# Patient Record
Sex: Female | Born: 1970 | Race: Asian | Hispanic: No | Marital: Married | Smoking: Never smoker
Health system: Southern US, Community
[De-identification: ages and names within clinical notes are randomized; demographics above are authoritative.]

## PROBLEM LIST (undated history)

## (undated) DIAGNOSIS — I10 Essential (primary) hypertension: Secondary | ICD-10-CM

## (undated) DIAGNOSIS — N6092 Unspecified benign mammary dysplasia of left breast: Secondary | ICD-10-CM

## (undated) DIAGNOSIS — N6320 Unspecified lump in the left breast, unspecified quadrant: Secondary | ICD-10-CM

## (undated) HISTORY — PX: NO PAST SURGERIES: SHX2092

## (undated) HISTORY — PX: BREAST EXCISIONAL BIOPSY: SUR124

---

## 2005-03-16 ENCOUNTER — Other Ambulatory Visit: Admission: RE | Admit: 2005-03-16 | Discharge: 2005-03-16 | Payer: Self-pay | Admitting: Gynecology

## 2006-04-12 ENCOUNTER — Other Ambulatory Visit: Admission: RE | Admit: 2006-04-12 | Discharge: 2006-04-12 | Payer: Self-pay | Admitting: Gynecology

## 2006-05-16 ENCOUNTER — Ambulatory Visit (HOSPITAL_COMMUNITY): Admission: RE | Admit: 2006-05-16 | Discharge: 2006-05-16 | Payer: Self-pay | Admitting: Gynecology

## 2007-04-02 ENCOUNTER — Inpatient Hospital Stay (HOSPITAL_COMMUNITY): Admission: AD | Admit: 2007-04-02 | Discharge: 2007-04-04 | Payer: Self-pay | Admitting: Obstetrics and Gynecology

## 2007-12-13 ENCOUNTER — Encounter: Admission: RE | Admit: 2007-12-13 | Discharge: 2007-12-13 | Payer: Self-pay | Admitting: Obstetrics and Gynecology

## 2008-06-14 ENCOUNTER — Other Ambulatory Visit: Admission: RE | Admit: 2008-06-14 | Discharge: 2008-06-14 | Payer: Self-pay | Admitting: Obstetrics and Gynecology

## 2008-06-14 ENCOUNTER — Encounter: Payer: Self-pay | Admitting: Obstetrics and Gynecology

## 2008-06-14 ENCOUNTER — Ambulatory Visit: Payer: Self-pay | Admitting: Obstetrics and Gynecology

## 2008-12-19 ENCOUNTER — Encounter: Admission: RE | Admit: 2008-12-19 | Discharge: 2008-12-19 | Payer: Self-pay | Admitting: Obstetrics and Gynecology

## 2009-06-16 ENCOUNTER — Other Ambulatory Visit: Admission: RE | Admit: 2009-06-16 | Discharge: 2009-06-16 | Payer: Self-pay | Admitting: Obstetrics and Gynecology

## 2009-06-16 ENCOUNTER — Ambulatory Visit: Payer: Self-pay | Admitting: Obstetrics and Gynecology

## 2010-01-09 ENCOUNTER — Encounter: Admission: RE | Admit: 2010-01-09 | Discharge: 2010-01-09 | Payer: Self-pay | Admitting: Obstetrics and Gynecology

## 2010-06-26 ENCOUNTER — Ambulatory Visit: Payer: Self-pay | Admitting: Gynecology

## 2010-06-26 ENCOUNTER — Other Ambulatory Visit
Admission: RE | Admit: 2010-06-26 | Discharge: 2010-06-26 | Payer: Self-pay | Source: Home / Self Care | Admitting: Gynecology

## 2010-07-12 NOTE — L&D Delivery Note (Signed)
Delivery Note At 4:14 AM a viable and healthy female was delivered via Vaginal, Spontaneous Delivery (Presentation:ROA ).  APGAR: 9,9 ; weight 6 lb 5 oz (2863 g).   Placenta status spontaneous and intact with 3 vessel Cord:  with the following complications: none .  Cord pH: na. Nuchal cord x one reduced on perineum.  Anesthesia:  epidural Episiotomy: none Lacerations: second degree Suture Repair: 2.0 vicryl rapide Est. Blood Loss (mL): 300  Mom to postpartum.  Baby to nursery-stable.  Christin Mccreedy J 05/27/2011, 4:29 AM

## 2010-11-24 NOTE — H&P (Signed)
NAME:  Keziah, MING-I                 ACCOUNT NO.:  [id number]   MEDICAL RECORD NO.:  [id number]          PATIENT TYPE:  INP   LOCATION:  9163                          FACILITY:  [facility]   PHYSICIAN:  Lenoard Aden, M.D.DATE OF BIRTH:  March 15, 1971   DATE OF ADMISSION:  04/02/2007  DATE OF DISCHARGE:                              HISTORY & PHYSICAL   PRESENTING COMPLAINT:  Labor.   She is a 40 year old Asian female, G3, P0 at 13 plus weeks' gestation  who presents with spontaneous labor.   She has no known drug allergies.   MEDICATIONS:  Prenatal vitamins.   She is a nonsmoker, nondrinker.   She denies domestic or physical violence.   PAST MEDICAL HISTORY:  Questionable fibroids, not seen on previous  ultrasound.  No other medical or surgical hospitalizations.   FAMILY HISTORY:  Diabetes, urolithiasis, uterine cancer, stroke, and  kidney disease.   PRENATAL COURSE:  Complicated by a questionable large for gestational  age fetus.   PHYSICAL EXAMINATION:  GENERAL:  She is a well-developed, well-  nourished, Asian female in no acute distress.  HEENT:  Normal.  LUNGS:  Clear.  HEART:  Regular rhythm.  ABDOMEN:  Soft, gravid.  Estimated fetal weight is 8 to 8-1/2 pounds.  PELVIC:  Cervix is 4-cm, 100% vertex, minus 1.  EXTREMITIES:  Revealed no cords.  NEUROLOGIC:  Nonfocal.  SKIN:  Intact.   IMPRESSION:  Term intrauterine pregnancy in active labor.   PLAN:  Epidural Pitocin as needed.  Anticipate cautious attempt at  vaginal delivery.      Lenoard Aden, M.D.  Electronically Signed     RJT/MEDQ  D:  04/02/2007  T:  04/02/2007  Job:  [id number]

## 2011-04-22 LAB — CBC
HCT: 38.9
Hemoglobin: 11.4 — ABNORMAL LOW
Hemoglobin: 13.6
MCHC: 35.7
MCV: 96.7
MCV: 96.8
RBC: 3.32 — ABNORMAL LOW
RDW: 13.9

## 2011-05-27 ENCOUNTER — Encounter (HOSPITAL_COMMUNITY): Payer: Self-pay | Admitting: Anesthesiology

## 2011-05-27 ENCOUNTER — Encounter (HOSPITAL_COMMUNITY): Payer: Self-pay | Admitting: *Deleted

## 2011-05-27 ENCOUNTER — Inpatient Hospital Stay (HOSPITAL_COMMUNITY): Payer: PRIVATE HEALTH INSURANCE | Admitting: Anesthesiology

## 2011-05-27 ENCOUNTER — Inpatient Hospital Stay (HOSPITAL_COMMUNITY)
Admission: AD | Admit: 2011-05-27 | Discharge: 2011-05-29 | DRG: 775 | Disposition: A | Discharge status: Home / Self Care | Payer: PRIVATE HEALTH INSURANCE | Source: Ambulatory Visit | Attending: Obstetrics and Gynecology | Admitting: Obstetrics and Gynecology

## 2011-05-27 DIAGNOSIS — Z23 Encounter for immunization: Secondary | ICD-10-CM

## 2011-05-27 DIAGNOSIS — O09529 Supervision of elderly multigravida, unspecified trimester: Secondary | ICD-10-CM | POA: Diagnosis present

## 2011-05-27 LAB — ABO/RH

## 2011-05-27 LAB — CBC
HCT: 37.8 % (ref 36.0–46.0)
Hemoglobin: 12.3 g/dL (ref 12.0–15.0)
MCH: 32 pg (ref 26.0–34.0)
MCHC: 32.5 g/dL (ref 30.0–36.0)

## 2011-05-27 LAB — HEPATITIS B SURFACE ANTIGEN: Hepatitis B Surface Ag: NEGATIVE

## 2011-05-27 LAB — STREP B DNA PROBE: GBS: NEGATIVE

## 2011-05-27 LAB — RPR: RPR: NONREACTIVE

## 2011-05-27 MED ORDER — FENTANYL 2.5 MCG/ML BUPIVACAINE 1/10 % EPIDURAL INFUSION (WH - ANES)
14.0000 mL/h | INTRAMUSCULAR | Status: DC
  Filled 2011-05-27: qty 60

## 2011-05-27 MED ORDER — IBUPROFEN 600 MG PO TABS
600.0000 mg | ORAL_TABLET | Freq: Four times a day (QID) | ORAL | Status: DC
  Administered 2011-05-27 – 2011-05-29 (×9): 600 mg via ORAL
  Filled 2011-05-27 (×9): qty 1

## 2011-05-27 MED ORDER — WITCH HAZEL-GLYCERIN EX PADS: 1.0000 "application " | MEDICATED_PAD | CUTANEOUS | Status: DC | PRN

## 2011-05-27 MED ORDER — SENNOSIDES-DOCUSATE SODIUM 8.6-50 MG PO TABS
2.0000 | ORAL_TABLET | Freq: Every day | ORAL | Status: DC
  Administered 2011-05-28: 2 via ORAL

## 2011-05-27 MED ORDER — IBUPROFEN 600 MG PO TABS: 600.0000 mg | ORAL_TABLET | Freq: Four times a day (QID) | ORAL | Status: DC | PRN

## 2011-05-27 MED ORDER — OXYCODONE-ACETAMINOPHEN 5-325 MG PO TABS: 2.0000 | ORAL_TABLET | ORAL | Status: DC | PRN

## 2011-05-27 MED ORDER — EPHEDRINE 5 MG/ML INJ
10.0000 mg | INTRAVENOUS | Status: DC | PRN
  Filled 2011-05-27: qty 4

## 2011-05-27 MED ORDER — PRENATAL PLUS 27-1 MG PO TABS
1.0000 | ORAL_TABLET | Freq: Every day | ORAL | Status: DC
  Administered 2011-05-27 – 2011-05-29 (×4): 1 via ORAL
  Filled 2011-05-27 (×3): qty 1

## 2011-05-27 MED ORDER — LACTATED RINGERS IV SOLN
500.0000 mL | Freq: Once | INTRAVENOUS | Status: AC
  Administered 2011-05-27: 1000 mL via INTRAVENOUS

## 2011-05-27 MED ORDER — LIDOCAINE HCL 1.5 % IJ SOLN
INTRAMUSCULAR | Status: DC | PRN
  Administered 2011-05-27 (×2): 5 mL via EPIDURAL

## 2011-05-27 MED ORDER — ZOLPIDEM TARTRATE 5 MG PO TABS: 5.0000 mg | ORAL_TABLET | Freq: Every evening | ORAL | Status: DC | PRN

## 2011-05-27 MED ORDER — TETANUS-DIPHTH-ACELL PERTUSSIS 5-2.5-18.5 LF-MCG/0.5 IM SUSP
0.5000 mL | Freq: Once | INTRAMUSCULAR | Status: AC
  Administered 2011-05-28: 0.5 mL via INTRAMUSCULAR
  Filled 2011-05-27: qty 0.5

## 2011-05-27 MED ORDER — BENZOCAINE-MENTHOL 20-0.5 % EX AERO
INHALATION_SPRAY | CUTANEOUS | Status: AC
  Administered 2011-05-27: 1 via TOPICAL
  Filled 2011-05-27: qty 56

## 2011-05-27 MED ORDER — FLEET ENEMA 7-19 GM/118ML RE ENEM: 1.0000 | ENEMA | RECTAL | Status: DC | PRN

## 2011-05-27 MED ORDER — FENTANYL 2.5 MCG/ML BUPIVACAINE 1/10 % EPIDURAL INFUSION (WH - ANES)
INTRAMUSCULAR | Status: DC | PRN
  Administered 2011-05-27: 14 mL/h via EPIDURAL

## 2011-05-27 MED ORDER — METHYLERGONOVINE MALEATE 0.2 MG/ML IJ SOLN: 0.2000 mg | INTRAMUSCULAR | Status: DC | PRN

## 2011-05-27 MED ORDER — LACTATED RINGERS IV SOLN: 500.0000 mL | INTRAVENOUS | Status: DC | PRN

## 2011-05-27 MED ORDER — LANOLIN HYDROUS EX OINT: TOPICAL_OINTMENT | CUTANEOUS | Status: DC | PRN

## 2011-05-27 MED ORDER — LACTATED RINGERS IV SOLN
INTRAVENOUS | Status: DC
  Administered 2011-05-27 (×2): via INTRAVENOUS

## 2011-05-27 MED ORDER — LIDOCAINE HCL (PF) 1 % IJ SOLN
30.0000 mL | INTRAMUSCULAR | Status: DC | PRN
  Filled 2011-05-27: qty 30

## 2011-05-27 MED ORDER — DIPHENHYDRAMINE HCL 50 MG/ML IJ SOLN: 12.5000 mg | INTRAMUSCULAR | Status: DC | PRN

## 2011-05-27 MED ORDER — ONDANSETRON HCL 4 MG/2ML IJ SOLN: 4.0000 mg | Freq: Four times a day (QID) | INTRAMUSCULAR | Status: DC | PRN

## 2011-05-27 MED ORDER — DIBUCAINE 1 % RE OINT: 1.0000 "application " | TOPICAL_OINTMENT | RECTAL | Status: DC | PRN

## 2011-05-27 MED ORDER — BENZOCAINE-MENTHOL 20-0.5 % EX AERO
1.0000 "application " | INHALATION_SPRAY | CUTANEOUS | Status: DC | PRN
  Administered 2011-05-27: 1 via TOPICAL

## 2011-05-27 MED ORDER — SIMETHICONE 80 MG PO CHEW: 80.0000 mg | CHEWABLE_TABLET | ORAL | Status: DC | PRN

## 2011-05-27 MED ORDER — ONDANSETRON HCL 4 MG/2ML IJ SOLN: 4.0000 mg | INTRAMUSCULAR | Status: DC | PRN

## 2011-05-27 MED ORDER — PHENYLEPHRINE 40 MCG/ML (10ML) SYRINGE FOR IV PUSH (FOR BLOOD PRESSURE SUPPORT): 80.0000 ug | PREFILLED_SYRINGE | INTRAVENOUS | Status: DC | PRN

## 2011-05-27 MED ORDER — ACETAMINOPHEN 325 MG PO TABS: 650.0000 mg | ORAL_TABLET | ORAL | Status: DC | PRN

## 2011-05-27 MED ORDER — METHYLERGONOVINE MALEATE 0.2 MG PO TABS: 0.2000 mg | ORAL_TABLET | ORAL | Status: DC | PRN

## 2011-05-27 MED ORDER — OXYCODONE-ACETAMINOPHEN 5-325 MG PO TABS: 1.0000 | ORAL_TABLET | ORAL | Status: DC | PRN

## 2011-05-27 MED ORDER — OXYTOCIN 20 UNITS IN LACTATED RINGERS INFUSION - SIMPLE: 125.0000 mL/h | INTRAVENOUS | Status: DC

## 2011-05-27 MED ORDER — CITRIC ACID-SODIUM CITRATE 334-500 MG/5ML PO SOLN: 30.0000 mL | ORAL | Status: DC | PRN

## 2011-05-27 MED ORDER — ONDANSETRON HCL 4 MG PO TABS: 4.0000 mg | ORAL_TABLET | ORAL | Status: DC | PRN

## 2011-05-27 MED ORDER — EPHEDRINE 5 MG/ML INJ: 10.0000 mg | INTRAVENOUS | Status: DC | PRN

## 2011-05-27 MED ORDER — PHENYLEPHRINE 40 MCG/ML (10ML) SYRINGE FOR IV PUSH (FOR BLOOD PRESSURE SUPPORT)
80.0000 ug | PREFILLED_SYRINGE | INTRAVENOUS | Status: DC | PRN
  Filled 2011-05-27: qty 5

## 2011-05-27 MED ORDER — OXYTOCIN BOLUS FROM INFUSION
500.0000 mL | Freq: Once | INTRAVENOUS | Status: DC
  Filled 2011-05-27: qty 1000
  Filled 2011-05-27: qty 500

## 2011-05-27 MED ORDER — DIPHENHYDRAMINE HCL 25 MG PO CAPS: 25.0000 mg | ORAL_CAPSULE | Freq: Four times a day (QID) | ORAL | Status: DC | PRN

## 2011-05-27 MED ORDER — OXYTOCIN 20 UNITS IN LACTATED RINGERS INFUSION - SIMPLE: 125.0000 mL/h | Freq: Once | INTRAVENOUS | Status: DC

## 2011-05-27 NOTE — Progress Notes (Signed)
  PPD 0 SVD  S:  Reports feeling tired / cramping / soreness with stitches             Tolerating po/ No nausea or vomiting             Bleeding is moderate             Pain controlled withprescription NSAID's including motrin             Up ad lib / ambulatory  Newborn breast feeding  / female infant  O:  A & O x 3 NAD             VS: Blood pressure 113/66, pulse 63, temperature 97.6 F (36.4 C), temperature source Oral, resp. rate 18, height 5\' 6"  (1.676 m), weight 73.483 kg (162 lb), SpO2 84.00%, unknown if currently breastfeeding.  LABS: Lab Results  Component Value Date   WBC 10.9* 05/27/2011   HGB 12.3 05/27/2011   HCT 37.8 05/27/2011   MCV 98.4 05/27/2011   PLT 301 05/27/2011     Lungs: Clear and unlabored  Heart: regular rate and rhythm / no mumurs  Abdomen: soft, non-tender, non-distended              Fundus: firm, non-tender, Ueven  Perineum: mild edema - ice pack in place  Lochia: light  Extremities: no edema, no calf pain or tenderness  A: PPD # 0 SVD   Doing well - stable status  P:  Routine post partum orders    Jayma Volpi 05/27/2011, 9:45 AM

## 2011-05-27 NOTE — Anesthesia Preprocedure Evaluation (Signed)
Anesthesia Evaluation  Patient identified by MRN, date of birth, ID band Patient awake    Reviewed: Allergy & Precautions, H&P , NPO status , Patient's Chart, lab work & pertinent test results, reviewed documented beta blocker date and time   Airway Mallampati: I TM Distance: >3 FB Neck ROM: full    Dental No notable dental hx. (+) Teeth Intact   Pulmonary neg pulmonary ROS,    Pulmonary exam normal       Cardiovascular neg cardio ROS     Neuro/Psych Negative Neurological ROS  Negative Psych ROS   GI/Hepatic negative GI ROS, Neg liver ROS,   Endo/Other  Negative Endocrine ROS  Renal/GU negative Renal ROS  Genitourinary negative   Musculoskeletal negative musculoskeletal ROS (+)   Abdominal Normal abdominal exam  (+)   Peds negative pediatric ROS (+)  Hematology negative hematology ROS (+)   Anesthesia Other Findings   Reproductive/Obstetrics negative OB ROS (+) Pregnancy                           Anesthesia Physical Anesthesia Plan  ASA: II  Anesthesia Plan: Epidural   Post-op Pain Management:    Induction:   Airway Management Planned:   Additional Equipment:   Intra-op Plan:   Post-operative Plan:   Informed Consent: I have reviewed the patients History and Physical, chart, labs and discussed the procedure including the risks, benefits and alternatives for the proposed anesthesia with the patient or authorized representative who has indicated his/her understanding and acceptance.   Dental Advisory Given and History available from chart only  Plan Discussed with: CRNA  Anesthesia Plan Comments:         Anesthesia Quick Evaluation

## 2011-05-27 NOTE — Anesthesia Procedure Notes (Signed)
Epidural Patient location during procedure: OB Start time: 05/27/2011 3:41 AM End time: 05/27/2011 3:45 AM Reason for block: procedure for pain  Staffing Anesthesiologist: Sandrea Hughs Performed by: anesthesiologist   Preanesthetic Checklist Completed: patient identified, site marked, surgical consent, pre-op evaluation, timeout performed, IV checked, risks and benefits discussed and monitors and equipment checked  Epidural Patient position: sitting Prep: site prepped and draped and DuraPrep Patient monitoring: continuous pulse ox and blood pressure Approach: midline Injection technique: LOR air  Needle:  Needle type: Tuohy  Needle gauge: 17 G Needle length: 9 cm Needle insertion depth: 5 cm cm Catheter type: closed end flexible Catheter size: 19 Gauge Catheter at skin depth: 10 cm Test dose: negative and 1.5% lidocaine  Assessment Sensory level: T10 Events: blood not aspirated, injection not painful, no injection resistance, negative IV test and no paresthesia

## 2011-05-27 NOTE — Progress Notes (Signed)
Pt reports ROM at 2300, some contractions. G2P1, svd

## 2011-05-27 NOTE — Progress Notes (Signed)
Ming-I Wool is a 40 y.o. G2P1001 at [redacted]w[redacted]d by LMP admitted for active labor  Subjective:   Objective: BP 128/95  Pulse 70  Temp(Src) 97.5 F (36.4 C) (Oral)  Resp 20  Ht 5\' 6"  (1.676 m)  Wt 73.483 kg (162 lb)  BMI 26.15 kg/m2  SpO2 84%      FHT:  FHR: 155 bpm, variability: moderate,  accelerations:  Present,  decelerations:  Absent UC:   regular, every 2 minutes SVE:   Dilation: 10 Effacement (%): 80 Station: +2 Exam by:: [email or web address]  Labs: Lab Results  Component Value Date   WBC 10.9* 05/27/2011   HGB 12.3 05/27/2011   HCT 37.8 05/27/2011   MCV 98.4 05/27/2011   PLT 301 05/27/2011    Assessment / Plan: Spontaneous labor, progressing normally  Labor: Progressing normally Preeclampsia:  na Fetal Wellbeing:  Category I Pain Control:  Epidural I/D:  n/a Anticipated MOD:  NSVD  Infiniti Hoefling J 05/27/2011, 4:25 AM

## 2011-05-27 NOTE — Anesthesia Postprocedure Evaluation (Signed)
Anesthesia Post Note  Patient: Mallory [name]  Procedure(s) Performed: * No procedures listed *  Anesthesia type: Epidural  Patient location: Mother/Baby  Post pain: Pain level controlled  Post assessment: Post-op Vital signs reviewed  Last Vitals:  Filed Vitals:   05/27/11 0613  BP: 113/66  Pulse: 63  Temp: 36.4 C  Resp: 18    Post vital signs: Reviewed  Level of consciousness: awake  Complications: No apparent anesthesia complications

## 2011-05-27 NOTE — H&P (Signed)
NAMEJENASCIA, [name]                 ACCOUNT NO.:  [id number]  MEDICAL RECORD NO.:  [id number]  LOCATION:  9167                          FACILITY:  [facility]  PHYSICIAN:  Lenoard Aden, M.D.DATE OF BIRTH:  [birth year]  DATE OF ADMISSION:  05/27/2011 DATE OF DISCHARGE:                             HISTORY & PHYSICAL   CHIEF COMPLAINT:  Labor.  HISTORY OF PRESENT ILLNESS:  She is a 40 year old Asian female, G2, P1, at 40 weeks' who presents in active labor with spontaneous rupture of membranes this a.m.  She is a nonsmoker, nondrinker.  She denies domestic or physical violence.  She has a history of advanced maternal age with a normal amniocentesis this pregnancy, otherwise uncomplicated pregnancy.  FAMILY HISTORY:  Remarkable for kidney stones, kidney disease, uterine cancer, diabetes, hypertension, stroke.  She has medications to include prenatal vitamins.  She has a previous history of spontaneous vaginal delivery in 2008.  PHYSICAL EXAMINATION:  GENERAL:  She is a well-developed, well-nourished Asian female, in no acute distress.  HEENT:  Normal. NECK:  Supple.  Full range of motion. LUNGS:  Clear to auscultation. ABDOMEN:  Soft, gravid, nontender.  Estimated fetal weight 7 pounds. Cervix 10 cm, 100% vertex, +2. EXTREMITIES:  There are no cords. NEUROLOGIC:  Nonfocal. SKIN:  Intact.  IMPRESSION:  Term intrauterine pregnancy in active labor.  PLAN:  Anticipate vaginal delivery.     Lenoard Aden, M.D.     RJT/MEDQ  D:  05/27/2011  T:  05/27/2011  Job:  [id number]

## 2011-05-28 LAB — CBC
HCT: 33.7 % — ABNORMAL LOW (ref 36.0–46.0)
Hemoglobin: 11 g/dL — ABNORMAL LOW (ref 12.0–15.0)
MCHC: 32.6 g/dL (ref 30.0–36.0)

## 2011-05-28 MED ORDER — INFLUENZA VIRUS VACC SPLIT PF IM SUSP
0.5000 mL | INTRAMUSCULAR | Status: AC
Start: 2011-05-29 — End: 2011-05-29
  Administered 2011-05-29: 0.5 mL via INTRAMUSCULAR
  Filled 2011-05-28: qty 0.5

## 2011-05-28 NOTE — Anesthesia Postprocedure Evaluation (Signed)
  Anesthesia Post-op Note  Patient: Mallory Stone  Procedure(s) Performed: * No procedures listed *  Patient Location: Mother/Baby  Anesthesia Type: Epidural  Level of Consciousness: awake, alert  and oriented  Airway and Oxygen Therapy: Patient Spontanous Breathing  Post-op Assessment: Patient's Cardiovascular Status Stable and Respiratory Function Stable  Post-op Vital Signs: Reviewed and stable  Complications: No apparent anesthesia complications

## 2011-05-28 NOTE — Progress Notes (Signed)
  PPD # 1  Subjective: Pt reports feeling well/ Pain controlled with prescription NSAID's including ibuprofen (Motrin) Tolerating po/ Voiding without problems/ No n/v Bleeding is light Newborn info:  Information for the patient's newborn:  Dilara, Navarrete Girl Ming-I [[id number]]  female Feeding: breast    Objective:  VS: Blood pressure 100/63, pulse 71, temperature 97.9 F (36.6 C), temperature source Oral, resp. rate 18  Basename 05/28/11 0510 05/27/11 0138  WBC 12.6* 10.9*  HGB 11.0* 12.3  HCT 33.7* 37.8  PLT 260 301    Blood type: A/Positive/-- (11/15 0131) Rubella: Immune (11/15 0132)    Physical Exam:  General: alert, cooperative and no distress CV: Regular rate and rhythm Resp: clear Abdomen: soft, nontender, normal bowel sounds Uterine Fundus: firm, below umbilicus, nontender Perineum: healing with good reapproximation Lochia: minimal Ext: Homans sign is negative, no sign of DVT and no edema, redness or tenderness in the calves or thighs   A/P: PPD # 1/ [id number] Doing well Continue routine post partum orders Anticipate discharge home in AM

## 2011-05-28 NOTE — Addendum Note (Signed)
Addendum  created 05/28/11 0808 by Edison Pace, CRNA   Modules edited:Charges VN, Notes Section

## 2011-05-29 MED ORDER — IBUPROFEN 600 MG PO TABS: 600.0000 mg | ORAL_TABLET | Freq: Four times a day (QID) | ORAL | Status: AC

## 2011-05-29 MED ORDER — INFLUENZA VIRUS VACC SPLIT PF IM SUSP: 0.5000 mL | INTRAMUSCULAR | Status: DC

## 2011-05-29 NOTE — Progress Notes (Signed)
Post Partum Day #2            Information for the patient's newborn:  Lashanna, Angelo Girl Ming-I [[id number]]  female   Feeding: breast  Subjective: No HA, SOB, CP, F/C, breast symptoms. Pain minimal . Normal vaginal bleeding, no clots.      Objective:  Temp:  [97.5 F (36.4 C)-98.4 F (36.9 C)] 97.5 F (36.4 C) (11/17 0646) Pulse Rate:  [71-92] 71  (11/17 0646) Resp:  [18-19] 18  (11/17 0646) BP: (103-113)/(60-68) 113/60 mmHg (11/17 0646)  No intake or output data in the 24 hours ending 05/29/11 1029     Basename 05/28/11 0510 05/27/11 0138  WBC 12.6* 10.9*  HGB 11.0* 12.3  HCT 33.7* 37.8  PLT 260 301    Blood type: A/Positive/-- (11/15 0131) Rubella: Immune (11/15 0132)    Physical Exam:  General: alert, cooperative and no distress Uterine Fundus: firm Lochia: appropriate Perineum: repair intact 2nd deg perineal, edema none DVT Evaluation: No evidence of DVT seen on physical exam. Negative Homan's sign. No significant calf/ankle edema.    Assessment/Plan: PPD # 2 / 40 y.o., [id number] S/P:spontaneous vaginal   Active Problems:  Postpartum care following vaginal delivery (11/15)    normal postpartum exam  Continue current postpartum care  D/C home   LOS: 2 days   Raianna Slight, CNM, MSN 05/29/2011, 10:29 AM

## 2011-05-29 NOTE — Discharge Summary (Signed)
Obstetric Discharge Summary Reason for Admission: onset of labor and rupture of membranes Prenatal Procedures: ultrasound Intrapartum Procedures: spontaneous vaginal delivery Postpartum Procedures: influenza and Tdap vaccine Complications-Operative and Postpartum: 2nd degree perineal laceration Hemoglobin  Date Value Range Status  05/28/2011 11.0* 12.0-15.0 (g/dL) Final     HCT  Date Value Range Status  05/28/2011 33.7* 36.0-46.0 (%) Final    Discharge Diagnoses: Term Pregnancy-delivered  Discharge Information: Date: 05/29/2011 Activity: pelvic rest Diet: routine Medications: PNV and Ibuprofen Condition: stable Instructions: refer to practice specific booklet Discharge to: home Follow-up Information    Follow up with Lenoard Aden, MD in 6 weeks.   Contact information:   [address] [place] [address] [phone]          Newborn Data: Live born female "Dia Sitter" Birth Weight: 6 lb 5 oz (2863 g) APGAR: 9,   Home with mother.  PAUL,DANIELA 05/29/2011, 10:33 AM

## 2012-06-14 ENCOUNTER — Other Ambulatory Visit: Payer: Self-pay | Admitting: Obstetrics and Gynecology

## 2012-06-14 DIAGNOSIS — Z1231 Encounter for screening mammogram for malignant neoplasm of breast: Secondary | ICD-10-CM

## 2012-06-29 ENCOUNTER — Ambulatory Visit: Payer: PRIVATE HEALTH INSURANCE

## 2012-07-13 ENCOUNTER — Ambulatory Visit
Admission: RE | Admit: 2012-07-13 | Discharge: 2012-07-13 | Disposition: A | Discharge status: Home / Self Care | Payer: PRIVATE HEALTH INSURANCE | Source: Ambulatory Visit | Attending: Obstetrics and Gynecology | Admitting: Obstetrics and Gynecology

## 2012-07-13 DIAGNOSIS — Z1231 Encounter for screening mammogram for malignant neoplasm of breast: Secondary | ICD-10-CM

## 2013-08-14 ENCOUNTER — Other Ambulatory Visit: Payer: Self-pay

## 2013-08-14 DIAGNOSIS — Z1231 Encounter for screening mammogram for malignant neoplasm of breast: Secondary | ICD-10-CM

## 2013-08-31 ENCOUNTER — Ambulatory Visit: Payer: PRIVATE HEALTH INSURANCE

## 2013-09-12 ENCOUNTER — Ambulatory Visit: Payer: PRIVATE HEALTH INSURANCE

## 2013-09-13 ENCOUNTER — Ambulatory Visit
Admission: RE | Admit: 2013-09-13 | Discharge: 2013-09-13 | Disposition: A | Discharge status: Home / Self Care | Payer: PRIVATE HEALTH INSURANCE | Source: Ambulatory Visit

## 2013-09-13 DIAGNOSIS — Z1231 Encounter for screening mammogram for malignant neoplasm of breast: Secondary | ICD-10-CM

## 2014-05-13 ENCOUNTER — Encounter (HOSPITAL_COMMUNITY): Payer: Self-pay | Admitting: *Deleted

## 2014-08-23 ENCOUNTER — Other Ambulatory Visit: Payer: Self-pay

## 2014-08-23 DIAGNOSIS — Z1231 Encounter for screening mammogram for malignant neoplasm of breast: Secondary | ICD-10-CM

## 2014-09-18 ENCOUNTER — Encounter (INDEPENDENT_AMBULATORY_CARE_PROVIDER_SITE_OTHER): Payer: Self-pay

## 2014-09-18 ENCOUNTER — Ambulatory Visit
Admission: RE | Admit: 2014-09-18 | Discharge: 2014-09-18 | Disposition: A | Discharge status: Home / Self Care | Payer: Commercial Managed Care - PPO | Source: Ambulatory Visit

## 2014-09-18 DIAGNOSIS — Z1231 Encounter for screening mammogram for malignant neoplasm of breast: Secondary | ICD-10-CM

## 2014-09-19 ENCOUNTER — Other Ambulatory Visit: Payer: Self-pay | Admitting: Obstetrics and Gynecology

## 2014-09-19 DIAGNOSIS — R928 Other abnormal and inconclusive findings on diagnostic imaging of breast: Secondary | ICD-10-CM

## 2014-09-23 ENCOUNTER — Ambulatory Visit
Admission: RE | Admit: 2014-09-23 | Discharge: 2014-09-23 | Disposition: A | Discharge status: Home / Self Care | Payer: Commercial Managed Care - PPO | Source: Ambulatory Visit | Attending: Obstetrics and Gynecology | Admitting: Obstetrics and Gynecology

## 2014-09-23 DIAGNOSIS — R928 Other abnormal and inconclusive findings on diagnostic imaging of breast: Secondary | ICD-10-CM

## 2015-09-02 ENCOUNTER — Other Ambulatory Visit: Payer: Self-pay

## 2015-09-02 DIAGNOSIS — Z1231 Encounter for screening mammogram for malignant neoplasm of breast: Secondary | ICD-10-CM

## 2015-09-26 ENCOUNTER — Ambulatory Visit
Admission: RE | Admit: 2015-09-26 | Discharge: 2015-09-26 | Disposition: A | Discharge status: Home / Self Care | Payer: Commercial Managed Care - PPO | Source: Ambulatory Visit

## 2015-09-26 DIAGNOSIS — Z1231 Encounter for screening mammogram for malignant neoplasm of breast: Secondary | ICD-10-CM

## 2016-10-01 ENCOUNTER — Other Ambulatory Visit: Payer: Self-pay | Admitting: Obstetrics and Gynecology

## 2016-10-01 DIAGNOSIS — Z1231 Encounter for screening mammogram for malignant neoplasm of breast: Secondary | ICD-10-CM

## 2016-10-04 ENCOUNTER — Ambulatory Visit
Admission: RE | Admit: 2016-10-04 | Discharge: 2016-10-04 | Disposition: A | Discharge status: Home / Self Care | Payer: Commercial Managed Care - PPO | Source: Ambulatory Visit | Attending: Obstetrics and Gynecology | Admitting: Obstetrics and Gynecology

## 2016-10-04 ENCOUNTER — Encounter: Payer: Self-pay | Admitting: Radiology

## 2016-10-04 DIAGNOSIS — Z1231 Encounter for screening mammogram for malignant neoplasm of breast: Secondary | ICD-10-CM

## 2016-10-06 ENCOUNTER — Other Ambulatory Visit: Payer: Self-pay | Admitting: Obstetrics and Gynecology

## 2016-10-06 DIAGNOSIS — R928 Other abnormal and inconclusive findings on diagnostic imaging of breast: Secondary | ICD-10-CM

## 2016-10-08 ENCOUNTER — Other Ambulatory Visit: Payer: Self-pay | Admitting: Obstetrics and Gynecology

## 2016-10-08 ENCOUNTER — Ambulatory Visit
Admission: RE | Admit: 2016-10-08 | Discharge: 2016-10-08 | Disposition: A | Discharge status: Home / Self Care | Payer: Commercial Managed Care - PPO | Source: Ambulatory Visit | Attending: Obstetrics and Gynecology | Admitting: Obstetrics and Gynecology

## 2016-10-08 DIAGNOSIS — R921 Mammographic calcification found on diagnostic imaging of breast: Secondary | ICD-10-CM

## 2016-10-08 DIAGNOSIS — R928 Other abnormal and inconclusive findings on diagnostic imaging of breast: Secondary | ICD-10-CM

## 2016-10-11 ENCOUNTER — Ambulatory Visit
Admission: RE | Admit: 2016-10-11 | Discharge: 2016-10-11 | Disposition: A | Discharge status: Home / Self Care | Payer: Commercial Managed Care - PPO | Source: Ambulatory Visit | Attending: Obstetrics and Gynecology | Admitting: Obstetrics and Gynecology

## 2016-10-11 DIAGNOSIS — R921 Mammographic calcification found on diagnostic imaging of breast: Secondary | ICD-10-CM

## 2016-10-25 ENCOUNTER — Other Ambulatory Visit: Payer: Self-pay | Admitting: General Surgery

## 2016-10-25 DIAGNOSIS — N6092 Unspecified benign mammary dysplasia of left breast: Secondary | ICD-10-CM

## 2016-11-19 ENCOUNTER — Other Ambulatory Visit: Payer: Self-pay | Admitting: General Surgery

## 2016-11-19 DIAGNOSIS — N6092 Unspecified benign mammary dysplasia of left breast: Secondary | ICD-10-CM

## 2016-11-26 ENCOUNTER — Encounter (HOSPITAL_BASED_OUTPATIENT_CLINIC_OR_DEPARTMENT_OTHER): Payer: Self-pay

## 2016-11-26 ENCOUNTER — Ambulatory Visit (HOSPITAL_BASED_OUTPATIENT_CLINIC_OR_DEPARTMENT_OTHER): Admit: 2016-11-26 | Payer: Commercial Managed Care - PPO | Admitting: General Surgery

## 2016-11-26 SURGERY — BREAST LUMPECTOMY WITH RADIOACTIVE SEED LOCALIZATION
Anesthesia: General | Laterality: Left

## 2016-12-10 ENCOUNTER — Encounter (HOSPITAL_BASED_OUTPATIENT_CLINIC_OR_DEPARTMENT_OTHER): Payer: Self-pay | Admitting: *Deleted

## 2016-12-13 ENCOUNTER — Encounter (HOSPITAL_BASED_OUTPATIENT_CLINIC_OR_DEPARTMENT_OTHER)
Admission: RE | Admit: 2016-12-13 | Discharge: 2016-12-13 | Disposition: A | Discharge status: Home / Self Care | Payer: Commercial Managed Care - PPO | Source: Ambulatory Visit | Attending: General Surgery | Admitting: General Surgery

## 2016-12-13 DIAGNOSIS — Z01812 Encounter for preprocedural laboratory examination: Secondary | ICD-10-CM | POA: Insufficient documentation

## 2016-12-13 DIAGNOSIS — N6320 Unspecified lump in the left breast, unspecified quadrant: Secondary | ICD-10-CM | POA: Insufficient documentation

## 2016-12-13 DIAGNOSIS — I10 Essential (primary) hypertension: Secondary | ICD-10-CM | POA: Insufficient documentation

## 2016-12-13 DIAGNOSIS — Z01810 Encounter for preprocedural cardiovascular examination: Secondary | ICD-10-CM | POA: Diagnosis present

## 2016-12-13 LAB — COMPREHENSIVE METABOLIC PANEL
ALT: 15 U/L (ref 14–54)
ANION GAP: 8 (ref 5–15)
AST: 19 U/L (ref 15–41)
Albumin: 4.2 g/dL (ref 3.5–5.0)
Alkaline Phosphatase: 51 U/L (ref 38–126)
BUN: 10 mg/dL (ref 6–20)
CHLORIDE: 103 mmol/L (ref 101–111)
CO2: 26 mmol/L (ref 22–32)
Calcium: 9.1 mg/dL (ref 8.9–10.3)
Creatinine, Ser: 0.66 mg/dL (ref 0.44–1.00)
GFR calc Af Amer: >60 mL/min (ref >60)
GFR calc non Af Amer: >60 mL/min (ref >60)
GLUCOSE: 95 mg/dL (ref 65–99)
POTASSIUM: 4.6 mmol/L (ref 3.5–5.1)
Sodium: 137 mmol/L (ref 135–145)
Total Bilirubin: 0.9 mg/dL (ref 0.3–1.2)
Total Protein: 7.2 g/dL (ref 6.5–8.1)

## 2016-12-13 LAB — CBC WITH DIFFERENTIAL/PLATELET
BASOS ABS: 0 10*3/uL (ref 0.0–0.1)
BASOS PCT: 0 %
EOS ABS: 0.3 10*3/uL (ref 0.0–0.7)
EOS PCT: 3 %
HEMATOCRIT: 39.3 % (ref 36.0–46.0)
Hemoglobin: 12.9 g/dL (ref 12.0–15.0)
Lymphocytes Relative: 28 %
Lymphs Abs: 2.6 10*3/uL (ref 0.7–4.0)
MCH: 31.3 pg (ref 26.0–34.0)
MCHC: 32.8 g/dL (ref 30.0–36.0)
MCV: 95.4 fL (ref 78.0–100.0)
MONO ABS: 0.5 10*3/uL (ref 0.1–1.0)
MONOS PCT: 5 %
NEUTROS ABS: 5.9 10*3/uL (ref 1.7–7.7)
Neutrophils Relative %: 64 %
PLATELETS: 311 10*3/uL (ref 150–400)
RBC: 4.12 MIL/uL (ref 3.87–5.11)
RDW: 12.1 % (ref 11.5–15.5)
WBC: 9.3 10*3/uL (ref 4.0–10.5)

## 2016-12-15 ENCOUNTER — Ambulatory Visit
Admission: RE | Admit: 2016-12-15 | Discharge: 2016-12-15 | Disposition: A | Discharge status: Home / Self Care | Payer: Commercial Managed Care - PPO | Source: Ambulatory Visit | Attending: General Surgery | Admitting: General Surgery

## 2016-12-15 DIAGNOSIS — N6092 Unspecified benign mammary dysplasia of left breast: Secondary | ICD-10-CM

## 2016-12-16 ENCOUNTER — Encounter (HOSPITAL_BASED_OUTPATIENT_CLINIC_OR_DEPARTMENT_OTHER): Payer: Self-pay | Admitting: General Surgery

## 2016-12-16 NOTE — H&P (Signed)
Mallory Stone Location: [facility] Patient #: [id number] DOB: 1971/05/14 Married / Language: English / Race: Asian Female        History of Present Illness  The patient is a 46 year old female who presents with a complaint of Breast problems. This is a 46 year old Asian female who returns for discussion and planning of left breast surgery. 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 Dr. Everlene Farrier is her gynecologist. She has a PCP in [facility] but does not remember the name      Recent screening mammograms show 7 mm focal area of calcifications in the left breast, upper outer quadrant. Image guided biopsy showed atypical ductal hyperplasia. There was a large hematoma almost 4 cm on exam but otherwise she did well.      Comorbidities include borderline hypertension.  family history is significant at mother was diagnosed with breast cancer age 81 and survived. Maternal aunt was diagnosed with breast cancer and older age and survived. No ovarian cancer. Social history reveals she is married and lives in [place]. Has 2 children. Denies tobacco. Alcohol occasionally. 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      She will be scheduled for left breast lumpectomy with radioactive seed localization. She is aware of the indications, techniques, and risks are discussed in detail last time. She had no more questions today.     I told her that we would offer to refer her to the high-risk breast clinic at the [facility] postop, assuming that she does not have cancer. She knows that if she does have cancer that we would assemble a treatment team to decide next steps.   Past Surgical History  No pertinent past surgical history   Diagnostic  Studies History  Colonoscopy  never Mammogram  within last year Pap Smear  1-5 years ago  Allergies  No Known Drug Allergies   Medication History  No Current Medications Probiotic Formula (Oral) Active. Medications Reconciled  Social History  Alcohol use  Occasional alcohol use. Caffeine use  Coffee, Tea. No drug use  Tobacco use  Never smoker.  Family History  Breast Cancer  Mother. Hypertension  Mother. Respiratory Condition  Father.  Pregnancy / Birth History  Age at menarche  13 years. Contraceptive History  Oral contraceptives. Gravida  2 Length (months) of breastfeeding  3-6 Maternal age  68-35 Para  2 Regular periods   Other Problems  No pertinent past medical history     Review of Systems  General Not Present- Appetite Loss, Chills, Fatigue, Fever, Night Sweats, Weight Gain and Weight Loss. Skin Not Present- Change in Wart/Mole, Dryness, Hives, Jaundice, New Lesions, Non-Healing Wounds, Rash and Ulcer. HEENT Present- Wears glasses/contact lenses. Not Present- Earache, Hearing Loss, Hoarseness, Nose Bleed, Oral Ulcers, Ringing in the Ears, Seasonal Allergies, Sinus Pain, Sore Throat, Visual Disturbances and Yellow Eyes. Respiratory Not Present- Bloody sputum, Chronic Cough, Difficulty Breathing, Snoring and Wheezing. Cardiovascular Not Present- Chest Pain, Difficulty Breathing Lying Down, Leg Cramps, Palpitations, Rapid Heart Rate, Shortness of Breath and Swelling of Extremities. Gastrointestinal Not Present- Abdominal Pain, Bloating, Bloody Stool, Change in Bowel Habits, Chronic diarrhea, Constipation, Difficulty Swallowing, Excessive gas, Gets full quickly at meals, Hemorrhoids, Indigestion, Nausea, Rectal Pain and  Vomiting. Female Genitourinary Not Present- Frequency, Nocturia, Painful Urination, Pelvic Pain and Urgency. Musculoskeletal Not Present- Back Pain, Joint Pain, Joint Stiffness, Muscle Pain, Muscle Weakness and Swelling of  Extremities. Neurological Not Present- Decreased Memory, Fainting, Headaches, Numbness, Seizures, Tingling, Tremor, Trouble walking and Weakness. Psychiatric Not Present- Anxiety, Bipolar, Change in Sleep Pattern, Depression, Fearful and Frequent crying. Endocrine Not Present- Cold Intolerance, Excessive Hunger, Hair Changes, Heat Intolerance, Hot flashes and New Diabetes. Hematology Not Present- Blood Thinners, Easy Bruising, Excessive bleeding, Gland problems, HIV and Persistent Infections  Vitals  Weight: 135 lb Height: 66in Body Surface Area: 1.69 m Body Mass Index: 21.79 kg/m  Temp.: 66F  Pulse: 82 (Regular)  BP: 150/92 (Sitting, Left Arm, Standard)   Physical Exam  General Mental Status-Alert. General Appearance-Not in acute distress. Build & Nutrition-Well nourished. Posture-Normal posture. Gait-Normal.  Head and Neck Head-normocephalic, atraumatic with no lesions or palpable masses. Trachea-midline. Thyroid Gland Characteristics - normal size and consistency and no palpable nodules.  Chest and Lung Exam Chest and lung exam reveals -on auscultation, normal breath sounds, no adventitious sounds and normal vocal resonance.  Breast Note: Small palpable hematoma left breast upper outer quadrant. No more than 1 cm now. Skin healthy. No other masses or skin changes in either breast. No axillary adenopathy.   Cardiovascular Cardiovascular examination reveals -normal heart sounds, regular rate and rhythm with no murmurs and femoral artery auscultation bilaterally reveals normal pulses, no bruits, no thrills.  Abdomen Inspection Inspection of the abdomen reveals - No Hernias. Palpation/Percussion Palpation and Percussion of the abdomen reveal - Soft, Non Tender, No Rigidity (guarding), No hepatosplenomegaly and No Palpable abdominal masses.  Neurologic Neurologic evaluation reveals -alert and oriented x 3 with no impairment of recent or  remote memory, normal attention span and ability to concentrate, normal sensation and normal coordination.  Musculoskeletal Normal Exam - Bilateral-Upper Extremity Strength Normal and Lower Extremity Strength Normal.    Assessment & Plan  ATYPICAL DUCTAL HYPERPLASIA OF LEFT BREAST (N60.92)  The hematoma in your left breast upper outer quadrant is much smaller. We will go ahead and plan and schedule your left breast lumpectomy with radioactive seed localization while you are in the office today  Once again, I discussed the fact that you had atypical ductal hyperplasia on your needle biopsy There is 10% or greater chance that you may have an early breast cancer now Hopefully this is not the case Nevertheless this is a risk factor for cancer in the future just as your family history of breast cancer is  After your surgery you will follow-up with me in the office in about 2 weeks and we'll decide what needs to be done, if anything  FAMILY HISTORY OF BREAST CANCER IN FEMALE (Z80.3) BORDERLINE HYPERTENSION (R03.0)   Angelia MouldHaywood M. Derrell LollingIngram, M.D., [facility] General and Minimally invasive Surgery Breast and Colorectal Surgery Office:   [phone] Pager:   [phone]

## 2016-12-17 ENCOUNTER — Ambulatory Visit (HOSPITAL_BASED_OUTPATIENT_CLINIC_OR_DEPARTMENT_OTHER): Payer: Commercial Managed Care - PPO | Admitting: Anesthesiology

## 2016-12-17 ENCOUNTER — Ambulatory Visit (HOSPITAL_BASED_OUTPATIENT_CLINIC_OR_DEPARTMENT_OTHER)
Admission: RE | Admit: 2016-12-17 | Discharge: 2016-12-17 | Disposition: A | Discharge status: Home / Self Care | Payer: Commercial Managed Care - PPO | Source: Ambulatory Visit | Attending: General Surgery | Admitting: General Surgery

## 2016-12-17 ENCOUNTER — Ambulatory Visit
Admission: RE | Admit: 2016-12-17 | Discharge: 2016-12-17 | Disposition: A | Discharge status: Home / Self Care | Payer: Commercial Managed Care - PPO | Source: Ambulatory Visit | Attending: General Surgery | Admitting: General Surgery

## 2016-12-17 ENCOUNTER — Encounter (HOSPITAL_BASED_OUTPATIENT_CLINIC_OR_DEPARTMENT_OTHER)
Admission: RE | Disposition: A | Discharge status: Home / Self Care | Payer: Self-pay | Source: Ambulatory Visit | Attending: General Surgery

## 2016-12-17 ENCOUNTER — Encounter (HOSPITAL_BASED_OUTPATIENT_CLINIC_OR_DEPARTMENT_OTHER): Payer: Self-pay

## 2016-12-17 DIAGNOSIS — Z803 Family history of malignant neoplasm of breast: Secondary | ICD-10-CM | POA: Insufficient documentation

## 2016-12-17 DIAGNOSIS — I10 Essential (primary) hypertension: Secondary | ICD-10-CM | POA: Diagnosis not present

## 2016-12-17 DIAGNOSIS — N6092 Unspecified benign mammary dysplasia of left breast: Secondary | ICD-10-CM | POA: Diagnosis present

## 2016-12-17 DIAGNOSIS — Z79899 Other long term (current) drug therapy: Secondary | ICD-10-CM | POA: Insufficient documentation

## 2016-12-17 DIAGNOSIS — N6012 Diffuse cystic mastopathy of left breast: Secondary | ICD-10-CM | POA: Diagnosis not present

## 2016-12-17 HISTORY — DX: Unspecified lump in the left breast, unspecified quadrant: N63.20

## 2016-12-17 HISTORY — DX: Unspecified benign mammary dysplasia of left breast: N60.92

## 2016-12-17 HISTORY — DX: Essential (primary) hypertension: I10

## 2016-12-17 HISTORY — PX: BREAST LUMPECTOMY WITH RADIOACTIVE SEED LOCALIZATION: SHX6424

## 2016-12-17 SURGERY — BREAST LUMPECTOMY WITH RADIOACTIVE SEED LOCALIZATION
Anesthesia: General | Site: Breast | Laterality: Left

## 2016-12-17 MED ORDER — LIDOCAINE 2% (20 MG/ML) 5 ML SYRINGE
INTRAMUSCULAR | Status: AC
Start: 2016-12-17 — End: 2016-12-17
  Filled 2016-12-17: qty 5

## 2016-12-17 MED ORDER — FENTANYL CITRATE (PF) 100 MCG/2ML IJ SOLN
INTRAMUSCULAR | Status: AC
  Filled 2016-12-17: qty 2

## 2016-12-17 MED ORDER — SUCCINYLCHOLINE CHLORIDE 200 MG/10ML IV SOSY
PREFILLED_SYRINGE | INTRAVENOUS | Status: AC
  Filled 2016-12-17: qty 10

## 2016-12-17 MED ORDER — EPHEDRINE SULFATE 50 MG/ML IJ SOLN
INTRAMUSCULAR | Status: DC | PRN
  Administered 2016-12-17: 10 mg via INTRAVENOUS

## 2016-12-17 MED ORDER — DEXAMETHASONE SODIUM PHOSPHATE 10 MG/ML IJ SOLN
INTRAMUSCULAR | Status: AC
  Filled 2016-12-17: qty 1

## 2016-12-17 MED ORDER — HYDROCODONE-ACETAMINOPHEN 5-325 MG PO TABS: 1.0000 | ORAL_TABLET | Freq: Four times a day (QID) | ORAL | 0 refills | Status: DC | PRN

## 2016-12-17 MED ORDER — GABAPENTIN 300 MG PO CAPS
ORAL_CAPSULE | ORAL | Status: AC
  Filled 2016-12-17: qty 1

## 2016-12-17 MED ORDER — LACTATED RINGERS IV SOLN
INTRAVENOUS | Status: DC
  Administered 2016-12-17: 08:00:00 via INTRAVENOUS

## 2016-12-17 MED ORDER — CEFAZOLIN SODIUM-DEXTROSE 2-4 GM/100ML-% IV SOLN
INTRAVENOUS | Status: AC
  Filled 2016-12-17: qty 100

## 2016-12-17 MED ORDER — CHLORHEXIDINE GLUCONATE CLOTH 2 % EX PADS: 6.0000 | MEDICATED_PAD | Freq: Once | CUTANEOUS | Status: DC

## 2016-12-17 MED ORDER — DEXAMETHASONE SODIUM PHOSPHATE 4 MG/ML IJ SOLN
INTRAMUSCULAR | Status: DC | PRN
  Administered 2016-12-17: 10 mg via INTRAVENOUS

## 2016-12-17 MED ORDER — CEFAZOLIN SODIUM-DEXTROSE 2-4 GM/100ML-% IV SOLN
2.0000 g | INTRAVENOUS | Status: AC
  Administered 2016-12-17: 2 g via INTRAVENOUS

## 2016-12-17 MED ORDER — MIDAZOLAM HCL 2 MG/2ML IJ SOLN
1.0000 mg | INTRAMUSCULAR | Status: DC | PRN
  Administered 2016-12-17: 2 mg via INTRAVENOUS

## 2016-12-17 MED ORDER — GABAPENTIN 300 MG PO CAPS
300.0000 mg | ORAL_CAPSULE | ORAL | Status: AC
  Administered 2016-12-17: 300 mg via ORAL

## 2016-12-17 MED ORDER — ACETAMINOPHEN 500 MG PO TABS
ORAL_TABLET | ORAL | Status: AC
  Filled 2016-12-17: qty 2

## 2016-12-17 MED ORDER — FENTANYL CITRATE (PF) 100 MCG/2ML IJ SOLN: 25.0000 ug | INTRAMUSCULAR | Status: DC | PRN

## 2016-12-17 MED ORDER — SCOPOLAMINE 1 MG/3DAYS TD PT72: 1.0000 | MEDICATED_PATCH | Freq: Once | TRANSDERMAL | Status: DC | PRN

## 2016-12-17 MED ORDER — MIDAZOLAM HCL 2 MG/2ML IJ SOLN
INTRAMUSCULAR | Status: AC
  Filled 2016-12-17: qty 2

## 2016-12-17 MED ORDER — PROPOFOL 10 MG/ML IV BOLUS
INTRAVENOUS | Status: DC | PRN
  Administered 2016-12-17 (×2): 150 mg via INTRAVENOUS

## 2016-12-17 MED ORDER — BUPIVACAINE HCL (PF) 0.5 % IJ SOLN
INTRAMUSCULAR | Status: AC
  Filled 2016-12-17: qty 30

## 2016-12-17 MED ORDER — CELECOXIB 200 MG PO CAPS
ORAL_CAPSULE | ORAL | Status: AC
  Filled 2016-12-17: qty 2

## 2016-12-17 MED ORDER — BUPIVACAINE-EPINEPHRINE (PF) 0.5% -1:200000 IJ SOLN
INTRAMUSCULAR | Status: DC | PRN
  Administered 2016-12-17: 10 mL via PERINEURAL

## 2016-12-17 MED ORDER — PHENYLEPHRINE 40 MCG/ML (10ML) SYRINGE FOR IV PUSH (FOR BLOOD PRESSURE SUPPORT)
PREFILLED_SYRINGE | INTRAVENOUS | Status: AC
  Filled 2016-12-17: qty 10

## 2016-12-17 MED ORDER — ACETAMINOPHEN 500 MG PO TABS
1000.0000 mg | ORAL_TABLET | ORAL | Status: AC
  Administered 2016-12-17: 1000 mg via ORAL

## 2016-12-17 MED ORDER — FENTANYL CITRATE (PF) 100 MCG/2ML IJ SOLN
50.0000 ug | INTRAMUSCULAR | Status: DC | PRN
Start: 2016-12-17 — End: 2016-12-17
  Administered 2016-12-17: 100 ug via INTRAVENOUS

## 2016-12-17 MED ORDER — BUPIVACAINE-EPINEPHRINE (PF) 0.5% -1:200000 IJ SOLN
INTRAMUSCULAR | Status: AC
  Filled 2016-12-17: qty 60

## 2016-12-17 MED ORDER — CELECOXIB 400 MG PO CAPS
400.0000 mg | ORAL_CAPSULE | ORAL | Status: AC
  Administered 2016-12-17: 400 mg via ORAL

## 2016-12-17 MED ORDER — LIDOCAINE HCL (CARDIAC) 20 MG/ML IV SOLN
INTRAVENOUS | Status: DC | PRN
  Administered 2016-12-17: 80 mg via INTRAVENOUS
  Administered 2016-12-17: 50 mg via INTRAVENOUS

## 2016-12-17 MED ORDER — ONDANSETRON HCL 4 MG/2ML IJ SOLN
INTRAMUSCULAR | Status: AC
  Filled 2016-12-17: qty 2

## 2016-12-17 MED ORDER — BUPIVACAINE-EPINEPHRINE (PF) 0.25% -1:200000 IJ SOLN
INTRAMUSCULAR | Status: AC
  Filled 2016-12-17: qty 60

## 2016-12-17 SURGICAL SUPPLY — 63 items
ADH SKN CLS APL DERMABOND .7 (GAUZE/BANDAGES/DRESSINGS) ×1
APL SKNCLS STERI-STRIP NONHPOA (GAUZE/BANDAGES/DRESSINGS)
APPLIER CLIP 9.375 MED OPEN (MISCELLANEOUS)
APR CLP MED 9.3 20 MLT OPN (MISCELLANEOUS)
BENZOIN TINCTURE PRP APPL 2/3 (GAUZE/BANDAGES/DRESSINGS) IMPLANT
BINDER BREAST LRG (GAUZE/BANDAGES/DRESSINGS) IMPLANT
BINDER BREAST MEDIUM (GAUZE/BANDAGES/DRESSINGS) ×2 IMPLANT
BINDER BREAST XLRG (GAUZE/BANDAGES/DRESSINGS) IMPLANT
BINDER BREAST XXLRG (GAUZE/BANDAGES/DRESSINGS) IMPLANT
BLADE HEX COATED 2.75 (ELECTRODE) ×3 IMPLANT
BLADE SURG 10 STRL SS (BLADE) IMPLANT
BLADE SURG 15 STRL LF DISP TIS (BLADE) ×1 IMPLANT
BLADE SURG 15 STRL SS (BLADE) ×3
CANISTER SUC SOCK COL 7IN (MISCELLANEOUS) IMPLANT
CANISTER SUCT 1200ML W/VALVE (MISCELLANEOUS) ×3 IMPLANT
CHLORAPREP W/TINT 26ML (MISCELLANEOUS) ×3 IMPLANT
CLIP APPLIE 9.375 MED OPEN (MISCELLANEOUS) IMPLANT
CLOSURE WOUND 1/2 X4 (GAUZE/BANDAGES/DRESSINGS)
COVER BACK TABLE 60X90IN (DRAPES) ×3 IMPLANT
COVER MAYO STAND STRL (DRAPES) ×3 IMPLANT
COVER PROBE W GEL 5X96 (DRAPES) ×3 IMPLANT
DECANTER SPIKE VIAL GLASS SM (MISCELLANEOUS) IMPLANT
DERMABOND ADVANCED (GAUZE/BANDAGES/DRESSINGS) ×2
DERMABOND ADVANCED .7 DNX12 (GAUZE/BANDAGES/DRESSINGS) ×1 IMPLANT
DEVICE DUBIN W/COMP PLATE 8390 (MISCELLANEOUS) ×3 IMPLANT
DRAPE LAPAROSCOPIC ABDOMINAL (DRAPES) ×3 IMPLANT
DRAPE UTILITY XL STRL (DRAPES) ×3 IMPLANT
DRSG PAD ABDOMINAL 8X10 ST (GAUZE/BANDAGES/DRESSINGS) ×2 IMPLANT
ELECT REM PT RETURN 9FT ADLT (ELECTROSURGICAL) ×3
ELECTRODE REM PT RTRN 9FT ADLT (ELECTROSURGICAL) ×1 IMPLANT
GAUZE SPONGE 4X4 12PLY STRL LF (GAUZE/BANDAGES/DRESSINGS) ×3 IMPLANT
GLOVE EUDERMIC 7 POWDERFREE (GLOVE) ×3 IMPLANT
GOWN STRL REUS W/ TWL LRG LVL3 (GOWN DISPOSABLE) ×1 IMPLANT
GOWN STRL REUS W/ TWL XL LVL3 (GOWN DISPOSABLE) ×1 IMPLANT
GOWN STRL REUS W/TWL LRG LVL3 (GOWN DISPOSABLE) ×3
GOWN STRL REUS W/TWL XL LVL3 (GOWN DISPOSABLE) ×3
ILLUMINATOR WAVEGUIDE N/F (MISCELLANEOUS) IMPLANT
KIT MARKER MARGIN INK (KITS) ×3 IMPLANT
LIGHT WAVEGUIDE WIDE FLAT (MISCELLANEOUS) IMPLANT
NDL HYPO 25X1 1.5 SAFETY (NEEDLE) ×1 IMPLANT
NEEDLE HYPO 25X1 1.5 SAFETY (NEEDLE) ×3 IMPLANT
NS IRRIG 1000ML POUR BTL (IV SOLUTION) ×3 IMPLANT
PACK BASIN DAY SURGERY FS (CUSTOM PROCEDURE TRAY) ×3 IMPLANT
PENCIL BUTTON HOLSTER BLD 10FT (ELECTRODE) ×3 IMPLANT
SHEET MEDIUM DRAPE 40X70 STRL (DRAPES) IMPLANT
SLEEVE SCD COMPRESS KNEE MED (MISCELLANEOUS) ×3 IMPLANT
SPONGE LAP 18X18 X RAY DECT (DISPOSABLE) IMPLANT
SPONGE LAP 4X18 X RAY DECT (DISPOSABLE) ×5 IMPLANT
STRIP CLOSURE SKIN 1/2X4 (GAUZE/BANDAGES/DRESSINGS) IMPLANT
SUT ETHILON 3 0 FSL (SUTURE) ×2 IMPLANT
SUT MNCRL AB 4-0 PS2 18 (SUTURE) ×3 IMPLANT
SUT SILK 2 0 SH (SUTURE) ×3 IMPLANT
SUT VIC AB 2-0 CT1 27 (SUTURE)
SUT VIC AB 2-0 CT1 TAPERPNT 27 (SUTURE) IMPLANT
SUT VIC AB 3-0 SH 27 (SUTURE)
SUT VIC AB 3-0 SH 27X BRD (SUTURE) IMPLANT
SUT VICRYL 3-0 CR8 SH (SUTURE) ×3 IMPLANT
SYR 10ML LL (SYRINGE) ×3 IMPLANT
TOWEL OR 17X24 6PK STRL BLUE (TOWEL DISPOSABLE) ×3 IMPLANT
TOWEL OR NON WOVEN STRL DISP B (DISPOSABLE) IMPLANT
TUBE CONNECTING 20'X1/4 (TUBING) ×1
TUBE CONNECTING 20X1/4 (TUBING) ×2 IMPLANT
YANKAUER SUCT BULB TIP NO VENT (SUCTIONS) ×3 IMPLANT

## 2016-12-17 NOTE — Anesthesia Procedure Notes (Signed)
Procedure Name: LMA Insertion Date/Time: 12/17/2016 8:37 AM Performed by: Zenia ResidesPAYNE, Dontez Hauss D Pre-anesthesia Checklist: Patient identified, Emergency Drugs available, Suction available and Patient being monitored Patient Re-evaluated:Patient Re-evaluated prior to inductionOxygen Delivery Method: Circle system utilized Preoxygenation: Pre-oxygenation with 100% oxygen Intubation Type: IV induction Ventilation: Mask ventilation without difficulty LMA: LMA inserted LMA Size: 3.0 Number of attempts: 1 Airway Equipment and Method: Bite block Placement Confirmation: positive ETCO2 Tube secured with: Tape Dental Injury: Teeth and Oropharynx as per pre-operative assessment

## 2016-12-17 NOTE — Discharge Instructions (Signed)
[facility] °Office Phone Number [phone] ° °BREAST BIOPSY/ PARTIAL MASTECTOMY: POST OP INSTRUCTIONS ° °Always review your discharge instruction sheet given to you by the facility where your surgery was performed. ° °IF YOU HAVE DISABILITY OR FAMILY LEAVE FORMS, YOU MUST BRING THEM TO THE OFFICE FOR PROCESSING.  DO NOT GIVE THEM TO YOUR DOCTOR. ° °1. A prescription for pain medication may be given to you upon discharge.  Take your pain medication as prescribed, if needed.  If narcotic pain medicine is not needed, then you may take acetaminophen (Tylenol) or ibuprofen (Advil) as needed. °2. Take your usually prescribed medications unless otherwise directed °3. If you need a refill on your pain medication, please contact your pharmacy.  They will contact our office to request authorization.  Prescriptions will not be filled after 5pm or on week-ends. °4. You should eat very light the first 24 hours after surgery, such as soup, crackers, pudding, etc.  Resume your normal diet the day after surgery. °5. Most patients will experience some swelling and bruising in the breast.  Ice packs and a good support bra will help.  Swelling and bruising can take several days to resolve.  °6. It is common to experience some constipation if taking pain medication after surgery.  Increasing fluid intake and taking a stool softener will usually help or prevent this problem from occurring.  A mild laxative (Milk of Magnesia or Miralax) should be taken according to package directions if there are no bowel movements after 48 hours. °7. Unless discharge instructions indicate otherwise, you may remove your bandages 24-48 hours after surgery, and you may shower at that time.  You may have steri-strips (small skin tapes) in place directly over the incision.  These strips should be left on the skin for 7-10 days.  If your surgeon used skin glue on the incision, you may shower in 24 hours.  The glue will flake off over the  next 2-3 weeks.  Any sutures or staples will be removed at the office during your follow-up visit. °8. ACTIVITIES:  You may resume regular daily activities (gradually increasing) beginning the next day.  Wearing a good support bra or sports bra minimizes pain and swelling.  You may have sexual intercourse when it is comfortable. °a. You may drive when you no longer are taking prescription pain medication, you can comfortably wear a seatbelt, and you can safely maneuver your car and apply brakes. °b. RETURN TO WORK:  ______________________________________________________________________________________ °9. You should see your doctor in the office for a follow-up appointment approximately two weeks after your surgery.  Your doctor’s nurse will typically make your follow-up appointment when she calls you with your pathology report.  Expect your pathology report 2-3 business days after your surgery.  You may call to check if you do not hear from us after three days. °10. OTHER INSTRUCTIONS: _______________________________________________________________________________________________ _____________________________________________________________________________________________________________________________________ °_____________________________________________________________________________________________________________________________________ °_____________________________________________________________________________________________________________________________________ ° °WHEN TO CALL YOUR DOCTOR: °1. Fever over 101.0 °2. Nausea and/or vomiting. °3. Extreme swelling or bruising. °4. Continued bleeding from incision. °5. Increased pain, redness, or drainage from the incision. ° °The clinic staff is available to answer your questions during regular business hours.  Please don’t hesitate to call and ask to speak to one of the nurses for clinical concerns.  If you have a medical emergency, go to the nearest  emergency room or call 911.  A surgeon from [facility] is always on call at the hospital. ° °For further questions, please visit [email or web address]  ° ° ° ° °  Post Anesthesia Home Care Instructions ° °Activity: °Get plenty of rest for the remainder of the day. A responsible individual must stay with you for 24 hours following the procedure.  °For the next 24 hours, DO NOT: °-Drive a car °-Operate machinery °-Drink alcoholic beverages °-Take any medication unless instructed by your physician °-Make any legal decisions or sign important papers. ° °Meals: °Start with liquid foods such as gelatin or soup. Progress to regular foods as tolerated. Avoid greasy, spicy, heavy foods. If nausea and/or vomiting occur, drink only clear liquids until the nausea and/or vomiting subsides. Call your physician if vomiting continues. ° °Special Instructions/Symptoms: °Your throat may feel dry or sore from the anesthesia or the breathing tube placed in your throat during surgery. If this causes discomfort, gargle with warm salt water. The discomfort should disappear within 24 hours. ° °If you had a scopolamine patch placed behind your ear for the management of post- operative nausea and/or vomiting: ° °1. The medication in the patch is effective for 72 hours, after which it should be removed.  Wrap patch in a tissue and discard in the trash. Wash hands thoroughly with soap and water. °2. You may remove the patch earlier than 72 hours if you experience unpleasant side effects which may include dry mouth, dizziness or visual disturbances. °3. Avoid touching the patch. Wash your hands with soap and water after contact with the patch. °  ° °

## 2016-12-17 NOTE — Transfer of Care (Signed)
Immediate Anesthesia Transfer of Care Note  Patient: Mallory Stone  Procedure(s) Performed: Procedure(s): LEFT BREAST LUMPECTOMY WITH RADIOACTIVE SEED LOCALIZATION (Left)  Patient Location: PACU  Anesthesia Type:General  Level of Consciousness: awake, alert  and drowsy  Airway & Oxygen Therapy: Patient Spontanous Breathing and Patient connected to face mask oxygen  Post-op Assessment: Report given to RN and Post -op Vital signs reviewed and stable  Post vital signs: Reviewed and stable  Last Vitals:  Vitals:   12/17/16 0718  BP: (!) 137/92  Pulse: 92  Resp: 16  Temp: 36.8 C    Last Pain:  Vitals:   12/17/16 0718  TempSrc: Oral         Complications: No apparent anesthesia complications

## 2016-12-17 NOTE — Op Note (Signed)
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 Dalbert Batman, M.D., FACS  Assistant:                      OR staff   Indication for Assistant: N/A  Operative Indications:    This is a 46 year old Asian female who is brought to the operating room for elective lumpectomy left breast     She was initially referred by Dorise Bullion at the [facility] for management of atypical ductal hyperplasia in the left breast upper outer quadrant. Dr. Dahlia Byes is her gynecologist.       Recent screening mammograms show 7 mm focal area of calcifications in the left breast, upper outer quadrant. Image guided biopsy showed atypical ductal hyperplasia. There was a large hematoma almost 4 cm on exam but otherwise she did well.  The hematoma has now essentially resolved      family history is significant at mother was diagnosed with breast cancer age 54 and survived. Maternal aunt was diagnosed with breast cancer at an  older age and survived. No ovarian cancer..      She will be scheduled for left breast lumpectomy with radioactive seed localization.      I told her that we would offer to refer her to the high-risk breast clinic at the [place] postop, assuming that she does not have cancer. She knows that if she does have cancer that we would assemble a treatment team to decide next steps.  Operative Findings:       The marker clip and radioactive seed were relatively superficial in the very lateral left breast, 2:30 position.  The specimen mammogram looked good with the marker clip and the seed in the center of the specimen.  There is no gross abnormality by palpation.  Because the area was superficial I took a very thin ellipse of  skin to avoid problems with the anterior margin.  Procedure in Detail:          The radioactive seed was placed 2 days ago by radiology.  The patient was brought to the operating room at [facility] day surgery center, and general LMA anesthesia was induced.  The left breast was prepped and draped in a sterile fashion.  Surgical timeout was performed.  Intravenous antibiotics were given.  0.5% Marcaine with epinephrine was used as a local infiltration anesthetic.  Using the neoprobe as a guide I fashioned a transverse radially oriented incision in the lateral left breast.  Incision was made with the knife.  The lumpectomy was performed with electrocautery and the neoprobe.  The specimen was removed and marked with silk sutures and a 6 color ink kit to orient the pathologist.  Specimen mammogram looked good.  The specimen was marked and sent to the lab.  Hemostasis was excellent and achieved electrocautery.  The wound was irrigated.  The lumpectomy cavity was marked with 5 metal clips according to our protocol.  The deeper breast tissues were closed with interrupted 3-0 Vicryl sutures and the skin closed with a running subcuticular 4-0 Monocryl and Dermabond.  Dry bandages and a breast binder were placed.  The patient tolerated the procedure well was taken to PACU in stable condition.  EBL 10 mL.  Counts correct.  Complications none.      Edsel Petrin. Dalbert Batman, M.D., FACS General and Minimally Invasive Surgery Breast and Colorectal Surgery   Addendum: I logged on to the [facility] and reviewed her prescription medication history.    12/17/2016 9:14 AM

## 2016-12-17 NOTE — Interval H&P Note (Signed)
History and Physical Interval Note:  12/17/2016 7:45 AM  Mallory Stone  has presented today for surgery, with the diagnosis of atypical ductal hyperplasia left breast  The various methods of treatment have been discussed with the patient and family. After consideration of risks, benefits and other options for treatment, the patient has consented to  Procedure(s): LEFT BREAST LUMPECTOMY WITH RADIOACTIVE SEED LOCALIZATION (Left) as a surgical intervention .  The patient's history has been reviewed, patient examined, no change in status, stable for surgery.  I have reviewed the patient's chart and labs.  Questions were answered to the patient's satisfaction.     Ernestene MentionINGRAM,Bill Yohn M

## 2016-12-17 NOTE — Anesthesia Postprocedure Evaluation (Signed)
Anesthesia Post Note  Patient: Mallory Stone  Procedure(s) Performed: Procedure(s) (LRB): LEFT BREAST LUMPECTOMY WITH RADIOACTIVE SEED LOCALIZATION (Left)     Patient location during evaluation: PACU Anesthesia Type: General Level of consciousness: awake and alert Pain management: pain level controlled Vital Signs Assessment: post-procedure vital signs reviewed and stable Respiratory status: spontaneous breathing, nonlabored ventilation, respiratory function stable and patient connected to nasal cannula oxygen Cardiovascular status: blood pressure returned to baseline and stable Postop Assessment: no signs of nausea or vomiting Anesthetic complications: no    Last Vitals:  Vitals:   12/17/16 1000 12/17/16 1045  BP: 131/81 138/74  Pulse: 65 (!) 59  Resp: (!) 21 18  Temp:  36.5 C    Last Pain:  Vitals:   12/17/16 1045  TempSrc:   PainSc: 2                  Cecile HearingStephen Edward Turk

## 2016-12-17 NOTE — Anesthesia Preprocedure Evaluation (Signed)
Anesthesia Evaluation  Patient identified by MRN, date of birth, ID band Patient awake    Reviewed: Allergy & Precautions, NPO status , Patient's Chart, lab work & pertinent test results  Airway Mallampati: II  TM Distance: >3 FB Neck ROM: Full    Dental  (+) Teeth Intact, Dental Advisory Given   Pulmonary neg pulmonary ROS,    Pulmonary exam normal breath sounds clear to auscultation       Cardiovascular Exercise Tolerance: Good hypertension, Pt. on medications Normal cardiovascular exam Rhythm:Regular Rate:Normal     Neuro/Psych negative neurological ROS  negative psych ROS   GI/Hepatic negative GI ROS, Neg liver ROS,   Endo/Other  negative endocrine ROS  Renal/GU negative Renal ROS     Musculoskeletal negative musculoskeletal ROS (+)   Abdominal   Peds  Hematology negative hematology ROS (+)   Anesthesia Other Findings Day of surgery medications reviewed with the patient.  Left breast mass  Reproductive/Obstetrics negative OB ROS                             Anesthesia Physical Anesthesia Plan  ASA: II  Anesthesia Plan: General   Post-op Pain Management:    Induction: Intravenous  PONV Risk Score and Plan: 3 and Ondansetron, Dexamethasone, Propofol, Midazolam and Scopolamine patch - Pre-op  Airway Management Planned: LMA  Additional Equipment:   Intra-op Plan:   Post-operative Plan: Extubation in OR  Informed Consent: I have reviewed the patients History and Physical, chart, labs and discussed the procedure including the risks, benefits and alternatives for the proposed anesthesia with the patient or authorized representative who has indicated his/her understanding and acceptance.   Dental advisory given  Plan Discussed with: CRNA  Anesthesia Plan Comments: (Risks/benefits of general anesthesia discussed with patient including risk of damage to teeth, lips, gum, and  tongue, nausea/vomiting, allergic reactions to medications, and the possibility of heart attack, stroke and death.  All patient questions answered.  Patient wishes to proceed.)        Anesthesia Quick Evaluation

## 2016-12-18 ENCOUNTER — Encounter (HOSPITAL_BASED_OUTPATIENT_CLINIC_OR_DEPARTMENT_OTHER): Payer: Self-pay | Admitting: General Surgery

## 2016-12-20 NOTE — Progress Notes (Signed)
Inform patient of Pathology report,.Final path shows atypical ductal hyperplasia. NO CANCER. I will discuss with her at next OV.  hmi

## 2017-07-11 ENCOUNTER — Telehealth: Payer: Self-pay | Admitting: Oncology

## 2017-07-11 NOTE — Telephone Encounter (Signed)
Spoke with patient regarding appointment D/T/Loc/Ph#.  I did call [facility] and per patient she does not need [facility] for visit

## 2017-07-20 ENCOUNTER — Ambulatory Visit: Payer: Commercial Managed Care - PPO | Admitting: Oncology

## 2017-08-01 NOTE — Progress Notes (Signed)
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 She underwent unilateral left diagnostic mammography with tomography at [facility] on 10/08/2016 showing: breast density category C. New indeterminate left breast calcifications spanning up to 7 mm.  Accordingly on 10/11/2016 she proceeded to biopsy of the left upper outer breast area in question. The pathology from this procedure showed ([id number]): Atypical ductal hyperplasia. Fibrocytic changes and uaual ductal hyperplasia. Associated microcalcifications. No malignancy identified.  She then proceeded to left breast lumpectomy ([id number]) on 12/17/2016 with pathology showing: Atypical ductal hyperplasia. Fibrocytic change. No malignancy identified.  The patient's subsequent history is as detailed below.  INTERVAL HISTORY: "Mallory Stone" reports that she is here today to follow up on her diagnosis of an atypical hyperplasia and breast calcifications in her left breast. She notes that she receives yearly mammography followed by a visit with Dr. Ronita Hipps in October or November, when she gets her Pap smears  REVIEW OF SYSTEMS: Mallory Stone reports that she is well overall. She gets yearly visits with Dr. Ronita Hipps. She also visits family practice, Dr. Claiborne Billings. She denies having other doctors.  For exercise she does cardio dancing and walking  and tries to get 10,000 steps daily.  She has a fit bit.  She denies unusual headaches, visual changes, nausea, vomiting, or dizziness. There has been no unusual cough, phlegm production, or pleurisy. This been no change in bowel or bladder habits. She denies unexplained fatigue or unexplained weight loss, bleeding, rash, or fever. A detailed review of systems was otherwise stable.    PAST MEDICAL HISTORY: Past Medical History:  Diagnosis Date  . Atypical ductal hyperplasia of left breast 12/17/2016  . Breast mass, left   . Hypertension   Denies migraines, GERD, asthma, heart palpitations or heart attacks, cataracts or glaucoma.  PAST SURGICAL HISTORY: Past Surgical History:  Procedure Laterality Date  . BREAST LUMPECTOMY WITH RADIOACTIVE SEED LOCALIZATION Left 12/17/2016   Procedure: LEFT BREAST LUMPECTOMY WITH RADIOACTIVE SEED LOCALIZATION;  Surgeon: Fanny Skates, MD;  Location: [facility];  Service: General;  Laterality: Left;  . NO PAST SURGERIES      FAMILY HISTORY Family History  Problem Relation Age of Onset  . Breast cancer Mother   Her father is alive at 57 years old and her mother is alive at 44 (as of January 2019). She reports that her mother was diagnosed with breast cancer at age 40. She has 2 brothers and no sisters. She denies any other family history of cancer or breast/ovarian cancers in the family.   GYNECOLOGIC HISTORY:  No LMP recorded.  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 Her husband goes by "[employer]. She notes that they are originally from [place].  Her oldest is son, Mallory Stone age 83. Her youngest is daughter, Mallory Stone age 58. She doesn't belong to a religion.       ADVANCED DIRECTIVES:   HEALTH MAINTENANCE: Social History   Tobacco Use  . Smoking status: Never Smoker  . Smokeless tobacco: Never Used  Substance Use Topics  . Alcohol use: No  . Drug use: No     Colonoscopy: none  PAP: up to date with Dr. Ronita Hipps  Bone density: none   No Known Allergies  Current Outpatient Medications  Medication Sig Dispense Refill  . HYDROcodone-acetaminophen (NORCO) 5-325 MG tablet Take 1-2 tablets by mouth every 6 (six) hours as needed for moderate pain or severe pain. 30 tablet 0  . Lactobacillus (PROBIOTIC ACIDOPHILUS PO) Take by mouth.    . losartan (COZAAR) 50 MG tablet Take 50 mg by mouth daily.     . Vitamin D, Cholecalciferol, 1000 units CAPS Take by mouth.     No current facility-administered medications for this visit.     OBJECTIVE: Young appearing [place] woman in no acute distress  Vitals:   08/03/17 1101  BP: (!) 158/87  Pulse: 96  Resp: 18  Temp: 98.2 F (36.8 C)  SpO2: 100%     Body mass index is 23.94 kg/m.   Wt Readings from Last 3 Encounters:  08/03/17 148 lb 4.8 oz (67.3 kg)  12/17/16 134 lb 6.4 oz (61 kg)  05/27/11 162 lb (73.5 kg)      ECOG FS:0 - Asymptomatic  Ocular: Sclerae unicteric, pupils round and equal Lymphatic: No cervical or supraclavicular adenopathy Lungs no rales or rhonchi Heart regular rate and rhythm Abd soft, nontender, positive bowel sounds MSK no focal spinal tenderness, no joint edema Neuro: non-focal, well-oriented, appropriate affect Breasts: The right breast is unremarkable.  The left breast is status post lumpectomy.  The cosmetic result is excellent.  There is no significant subjacent induration below the scar, which is in the lateral aspect of the breast.  There are no suspicious masses and no skin or nipple changes of concern.  Both axillae are benign.   LAB RESULTS:  CMP     Component Value Date/Time   NA 137 12/13/2016 1215   K 4.6 12/13/2016 1215   CL 103 12/13/2016  1215   CO2 26 12/13/2016 1215   GLUCOSE 95 12/13/2016 1215   BUN 10 12/13/2016 1215   CREATININE 0.66 12/13/2016 1215   CALCIUM 9.1 12/13/2016 1215   PROT 7.2 12/13/2016 1215   ALBUMIN 4.2 12/13/2016 1215   AST 19 12/13/2016 1215   ALT 15 12/13/2016 1215   ALKPHOS 51 12/13/2016 1215   BILITOT 0.9 12/13/2016 1215   GFRNONAA >60 12/13/2016 1215   GFRAA >60 12/13/2016 1215    No results found for: TOTALPROTELP, ALBUMINELP, A1GS, A2GS, BETS, BETA2SER, GAMS, MSPIKE, SPEI  No results found for: KPAFRELGTCHN, LAMBDASER, KAPLAMBRATIO  Lab Results  Component Value Date   WBC 9.3 12/13/2016   NEUTROABS 5.9 12/13/2016   HGB 12.9 12/13/2016   HCT 39.3 12/13/2016   MCV 95.4 12/13/2016   PLT 311 12/13/2016    _0 @  No results found for: LABCA2  No components found for: YQMGNO037  No results for input(s): INR in the last 168 hours.  No results found for: LABCA2  No results found for: CWU889  No results found for: VQX450  No results found for: TUU828  No results found for: CA2729  No components found for: HGQUANT  No results  found for: CEA1 / No results found for: CEA1   No results found for: AFPTUMOR  No results found for: CHROMOGRNA  No results found for: PSA1  No visits with results within 3 Day(s) from this visit.  Latest known visit with results is:  Admission on 12/17/2016, Discharged on 12/17/2016  Component Date Value Ref Range Status  . WBC 12/13/2016 9.3  4.0 - 10.5 K/uL Final  . RBC 12/13/2016 4.12  3.87 - 5.11 MIL/uL Final  . Hemoglobin 12/13/2016 12.9  12.0 - 15.0 g/dL Final  . HCT 12/13/2016 39.3  36.0 - 46.0 % Final  . MCV 12/13/2016 95.4  78.0 - 100.0 fL Final  . MCH 12/13/2016 31.3  26.0 - 34.0 pg Final  . MCHC 12/13/2016 32.8  30.0 - 36.0 g/dL Final  . RDW 12/13/2016 12.1  11.5 - 15.5 % Final  . Platelets 12/13/2016 311  150 - 400 K/uL Final  . Neutrophils Relative % 12/13/2016 64  % Final  . Neutro Abs 12/13/2016 5.9  1.7 - 7.7  K/uL Final  . Lymphocytes Relative 12/13/2016 28  % Final  . Lymphs Abs 12/13/2016 2.6  0.7 - 4.0 K/uL Final  . Monocytes Relative 12/13/2016 5  % Final  . Monocytes Absolute 12/13/2016 0.5  0.1 - 1.0 K/uL Final  . Eosinophils Relative 12/13/2016 3  % Final  . Eosinophils Absolute 12/13/2016 0.3  0.0 - 0.7 K/uL Final  . Basophils Relative 12/13/2016 0  % Final  . Basophils Absolute 12/13/2016 0.0  0.0 - 0.1 K/uL Final  . Sodium 12/13/2016 137  135 - 145 mmol/L Final  . Potassium 12/13/2016 4.6  3.5 - 5.1 mmol/L Final  . Chloride 12/13/2016 103  101 - 111 mmol/L Final  . CO2 12/13/2016 26  22 - 32 mmol/L Final  . Glucose, Bld 12/13/2016 95  65 - 99 mg/dL Final  . BUN 12/13/2016 10  6 - 20 mg/dL Final  . Creatinine, Ser 12/13/2016 0.66  0.44 - 1.00 mg/dL Final  . Calcium 12/13/2016 9.1  8.9 - 10.3 mg/dL Final  . Total Protein 12/13/2016 7.2  6.5 - 8.1 g/dL Final  . Albumin 12/13/2016 4.2  3.5 - 5.0 g/dL Final  . AST 12/13/2016 19  15 - 41 U/L Final  . ALT 12/13/2016 15  14 - 54 U/L Final  . Alkaline Phosphatase 12/13/2016 51  38 - 126 U/L Final  . Total Bilirubin 12/13/2016 0.9  0.3 - 1.2 mg/dL Final  . GFR calc non Af Amer 12/13/2016 >60  >60 mL/min Final  . GFR calc Af Amer 12/13/2016 >60  >60 mL/min Final   Comment: (NOTE) The eGFR has been calculated using the CKD EPI equation. This calculation has not been validated in all clinical situations. eGFR's persistently <60 mL/min signify possible Chronic Kidney Disease.   . Anion gap 12/13/2016 8  5 - 15 Final    (this displays the last labs from the last 3 days)  No results found for: TOTALPROTELP, ALBUMINELP, A1GS, A2GS, BETS, BETA2SER, GAMS, MSPIKE, SPEI (this displays SPEP labs)  No results found for: KPAFRELGTCHN, LAMBDASER, KAPLAMBRATIO (kappa/lambda light chains)  No results found for: HGBA, HGBA2QUANT, HGBFQUANT, HGBSQUAN (Hemoglobinopathy evaluation)   No results found for: LDH  No results found for: IRON, TIBC,  IRONPCTSAT (Iron and TIBC)  No results found for: FERRITIN  Urinalysis No results found for: COLORURINE, APPEARANCEUR, LABSPEC, PHURINE, GLUCOSEU, HGBUR, BILIRUBINUR, KETONESUR, PROTEINUR, UROBILINOGEN, NITRITE, LEUKOCYTESUR   STUDIES: Pathology results discussed with the patient  also received copies  ELIGIBLE FOR AVAILABLE RESEARCH PROTOCOL: no  ASSESSMENT: 47 y.o. [place], [place] woman status post left lumpectomy 12/17/2016 for atypical ductal hyperplasia  (1) breast density category C  (2) first-degree relative with history of breast cancer (mother in her 51s)  (43) first live birth older than age 65  PLAN: We spent the better part of today's 50-minute appointment discussing the biology of breast cancer in general, and the specifics of atypical ductal hyperplasia [ADH] more specifically. Mallory Stone understands ADH means, first, relatively unrestricted growth of breast cells and, in addition, some morphologically different features from simple hyperplasia. Atypical ductal hyperplasia can be difficult to tell apart ductal cancer in situ. For those reasons the ADH lesionneeds to be removed.  ADH is also a marker of breast cancer risk. The risk of developing breast cancer in patients with ADH falls somewhere between one half and 1% per year. The new cancer can develop in either breast. One half of those tumors would be invasive.  Clear has additional risk factors including late first live birth, first degree relative with breast cancer, and dense breasts.  I think a 1 %/year risk of breast cancer is reasonable in her case.  This means that if she lives to age 52, which would be about average, she would have a 40% risk of developing breast cancer in her lifetime.  One half of those would be noninvasive and therefore not life-threatening.  20% would be invasive and therefore more dangerous.  [place] has essentially two ways of lowering that risk. One of them is bilateral mastectomies. This is not  recommended for this indication and I do not believe it would be covered by insurance even if she wanted it which she doesn't. The other, more reasonable approach would be to take anti-estrogens.  In a premenopausal patient, this could be tamoxifen or raloxifene, with the former preferred  We then discussed the possible toxicities, side effects and complications of tamoxifen.  Note that she took oral contraceptives for approximately 2 years in the past with no complications which means her risk of developing a clot from tamoxifen is going to be very low.  In addition, she understands tamoxifen may interrupt her periods.  So long as she continues to have periods however she will be regularly shedding her uterine lining and therefore concerns regarding polyps and uterine cancers will be minimized.  We also discussed intensified screening. This could be adding yearly MRI to yearly mammography with tomography. This approach greatly increases sensitivity. Any breast cancer that may develop should be found at the earliest possible stage. However intensified screening has not been documented to improve survival in this population. There are issues regarding cost even if insurance coverage is obtained. There also concerns regarding false positives especially in the first or second year of MRI screening.  Mammography twice a year also has not been shown to improve survival and would increase the radiation dose to the breasts.  After all this discussion, clear is interested in trying tamoxifen and I have written a prescription for her.  Ideally she would have biannual breast exams.  We can certainly do one here in April and Dr. Arlis Porta and another one in October or November.  She will continue to have mammography yearly with tomography as already scheduled through Dr. Ronita Hipps.  She feels very reassured about this plan.  I have urged her to call us with any questions or concerns regarding tamoxifen.  I am going to see her  in  April just to make sure she is tolerating it without any concerns.  If that is the case I will start seeing her on a once a year basis until she completes her 5 years of treatment  Clear has a good understanding of the overall plan. She agrees with it. She knows the goal of treatment in her case is prevention. She will call with any problems that may develop before her next visit here.   Daine Gunther, Virgie Dad, MD  08/03/17 11:27 AM Medical Oncology and Hematology [facility] [address] [place], [place] [address] Tel. [phone]    Fax. [phone]  This document serves as a record of services personally performed by Lurline Del, MD. It was created on his behalf by Sheron Nightingale, a trained medical scribe. The creation of this record is based on the scribe's personal observations and the provider's statements to them.   I have reviewed the above documentation for accuracy and completeness, and I agree with the above.

## 2017-08-03 ENCOUNTER — Inpatient Hospital Stay: Payer: Commercial Managed Care - PPO | Attending: Oncology | Admitting: Oncology

## 2017-08-03 ENCOUNTER — Telehealth: Payer: Self-pay | Admitting: Oncology

## 2017-08-03 VITALS — BP 158/87 | HR 96 | Temp 98.2°F | Resp 18 | Ht 66.0 in | Wt 148.3 lb

## 2017-08-03 DIAGNOSIS — N6092 Unspecified benign mammary dysplasia of left breast: Secondary | ICD-10-CM | POA: Insufficient documentation

## 2017-08-03 DIAGNOSIS — Z79810 Long term (current) use of selective estrogen receptor modulators (SERMs): Secondary | ICD-10-CM | POA: Insufficient documentation

## 2017-08-03 DIAGNOSIS — Z803 Family history of malignant neoplasm of breast: Secondary | ICD-10-CM | POA: Diagnosis not present

## 2017-08-03 MED ORDER — TAMOXIFEN CITRATE 20 MG PO TABS: 20.0000 mg | ORAL_TABLET | Freq: Every day | ORAL | 12 refills | Status: AC

## 2017-08-03 NOTE — Telephone Encounter (Signed)
Gave patient avs and calendar with appts per 1/23 los.  °

## 2017-09-19 ENCOUNTER — Other Ambulatory Visit: Payer: Self-pay | Admitting: Obstetrics and Gynecology

## 2017-09-19 DIAGNOSIS — Z1231 Encounter for screening mammogram for malignant neoplasm of breast: Secondary | ICD-10-CM

## 2017-09-27 ENCOUNTER — Telehealth: Payer: Self-pay

## 2017-09-27 NOTE — Telephone Encounter (Signed)
Received VM from pt regarding questions about billing and insurance.  Returned her call, she reports that her bill from recent visit here was $267 and she verbalized she was expecting primarily to pay for a specialist visit but [facility] said the cost is due to that the office is based in a hospital, billed as outpatient.  I let patient know I will call her back tomorrow after I speak with finance advocate and will also speak with billing dept about her concerns.  Pt voiced understanding and no other needs per pt at this time.

## 2017-09-29 ENCOUNTER — Encounter: Payer: Self-pay | Admitting: Oncology

## 2017-09-29 ENCOUNTER — Telehealth: Payer: Self-pay | Admitting: *Deleted

## 2017-09-29 NOTE — Telephone Encounter (Signed)
See phone note from 3/19- this RN inboxed managed care for further contact with the patient to discuss her concerns.

## 2017-09-29 NOTE — Progress Notes (Signed)
Called patient from staff message received regarding billing question.  Patient was very frustrated on the bill she received for a hospital outpatient visit. Explained to patient that the billing changed to hospital outpatient in January. She states she called the billing department and was told she had to pay that amount because she had not met her deductible. She thought she would only be responsible for paying the copay due to the length of the visit. She states her bill says that the insurance paid 0 and she doesn't understand why. I advised patient I understand her frustration, however she may want to contact her insurance company to get clarification on her plan and what and when the insurance will pay and what she would be responsible for.   Spent a lengthy amount of time trying to explain to the patient the new billing process and how her insurance company would be able to elaborate on her plan. Advised patient I would have my manager Juliann Pulse give her a follow up call. Patient states she will call her insurance company as well.

## 2017-10-06 ENCOUNTER — Ambulatory Visit
Admission: RE | Admit: 2017-10-06 | Discharge: 2017-10-06 | Disposition: A | Discharge status: Home / Self Care | Payer: Commercial Managed Care - PPO | Source: Ambulatory Visit | Attending: Obstetrics and Gynecology | Admitting: Obstetrics and Gynecology

## 2017-10-06 DIAGNOSIS — Z1231 Encounter for screening mammogram for malignant neoplasm of breast: Secondary | ICD-10-CM

## 2017-10-17 ENCOUNTER — Ambulatory Visit: Payer: Commercial Managed Care - PPO | Admitting: Oncology

## 2017-12-23 ENCOUNTER — Telehealth: Payer: Self-pay | Admitting: *Deleted

## 2017-12-23 NOTE — Telephone Encounter (Signed)
This RN spoke with pt per her call stating concern due to " I will need a refill on the tamoxifen but cannot afford to pay $300 to see Dr Darnelle CatalanMagrinat "  Ms Elnoria HowardHung states she originally thought she would only be responsible for the copay with MD visits- but she has now found out due to how this office bills she would need to meet a deductible.  She states presently she has a $300 bill " that I cannot pay ". " I called the hospital and tried to negotiate with them but I failed "  Ms Elnoria HowardHung is concerned due to need to be seen for known history of breast cancer and obtain refills  " but I cannot afford $300 ".  She presently has a little over a month of tamoxifen left.  This note will be forwarded to MD.  Note pt has called previously regarding this issue and has spoken to managed care.

## 2018-01-02 ENCOUNTER — Other Ambulatory Visit: Payer: Self-pay | Admitting: Oncology

## 2018-01-02 MED ORDER — TAMOXIFEN CITRATE 20 MG PO TABS: 20.0000 mg | ORAL_TABLET | Freq: Every day | ORAL | 0 refills | Status: AC

## 2018-01-02 NOTE — Progress Notes (Signed)
This patient has not been showing because she says she has an outstanding $300 bill.  We have tried to get this worked out through billing.  I have refilled her tamoxifen for 3 months and made her an appointment for late August.  Hopefully her deductible will have been met by them.

## 2018-02-27 ENCOUNTER — Telehealth: Payer: Self-pay | Admitting: Adult Health

## 2018-02-27 NOTE — Telephone Encounter (Signed)
Patient called to cancel °

## 2018-03-06 ENCOUNTER — Ambulatory Visit: Payer: Commercial Managed Care - PPO | Admitting: Adult Health

## 2018-06-02 ENCOUNTER — Telehealth: Payer: Self-pay | Admitting: *Deleted

## 2018-06-02 MED ORDER — TAMOXIFEN CITRATE 20 MG PO TABS: 20.0000 mg | ORAL_TABLET | Freq: Every day | ORAL | 0 refills | Status: DC

## 2018-06-02 NOTE — Telephone Encounter (Signed)
This RN spoke with pt per her call stating she needs refill on tamoxifen for 90 days due to she will be leaving to be out [place] on [place]14 for 1 month.  This RN discussed need for follow up appointment ( noted no show in June ) - [place] states she does not like that with each visit she has an out of pocket facility fee of [phone] " that is challenging for me "- " so what can we work out "  Note pt has spoken to our managed care regarding above - pt has insurance with a deductible that she has not met which would lower her cost.  This RN re iterated above - and that is part of the insurance policy she has subscribed to - and ideally she needs to plan on spending out of pocket for health care until she meets her deductible.  Rumbaugh stated understanding " but still frustrating because this money does not go to the doctor but to the building "  She inquired about transferring her care to another facility that does not do this charge.  This RN informed pt that unless an office is owned by the doctors - which there are no oncology offices that are locally - all facilities have a facility fee.  This RN validated pt's concerns but also per legality of MD prescribing medication - pt needs to be seen appropriately.  Rauth was appreciative of discussion - though she would like not to pay and made several attempts to " negotiate " the facility fee.  Per end of conversation appointment made for January 2020 with pt understanding that MD or nurse cannot negotiate the facility fee.  Refill sent to pt's pharmacy.

## 2018-08-01 NOTE — Progress Notes (Signed)
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 She underwent unilateral left diagnostic mammography with tomography at [facility] on 10/08/2016 showing: breast density category C. New indeterminate left breast calcifications spanning up to 7 mm.  Accordingly on 10/11/2016 she proceeded to biopsy of the left upper outer breast area in question. The pathology from this procedure showed ([id number]): Atypical ductal hyperplasia. Fibrocytic changes and uaual ductal hyperplasia. Associated microcalcifications. No malignancy identified.  She then proceeded to left breast lumpectomy ([id number]) on 12/17/2016 with pathology showing: Atypical ductal hyperplasia. Fibrocytic change. No malignancy identified.  The patient's subsequent history is as detailed below.   INTERVAL HISTORY: Mallory Stone returns today for follow-up and treatment of her atypical hyperplasia and breast calcifications in her left breast.   She continues on tamoxifen. She got hot flashes at night in the beginning, but. Her period stopped in 10/2017; she reached out to her OBGYN to ask about menopause.   Her last mammography was on 10/06/2017 at [facility] showing Breast Density Category C. There is no mammographic evidence for malignancy.   Mallory Stone has been unable to  keep previously scheduled appointments with our office due to the [facility] facility fee that she is charged and must pay out of pocket each visit until she reaches her deductible.    REVIEW OF SYSTEMS: For exercise, Mallory Stone walks and does zumba at the [facility]. When she walks, she usually reaches 7,000 steps per day. Mallory Stone mentions that as she has gotten a little older, her vision has changed some. The patient denies unusual headaches, nausea, vomiting, or dizziness. There has been no unusual cough, phlegm production, or pleurisy. This been no change in bowel or bladder habits. The patient denies unexplained fatigue or unexplained weight loss, bleeding, rash, or fever. A detailed review of systems was otherwise noncontributory.     PAST MEDICAL HISTORY: Past Medical History:  Diagnosis Date  . Atypical ductal hyperplasia of left breast 12/17/2016  . Breast mass, left   . Hypertension   Denies migraines, GERD, asthma, heart palpitations or heart attacks, cataracts or glaucoma.  PAST SURGICAL HISTORY: Past Surgical History:  Procedure Laterality Date  . BREAST EXCISIONAL BIOPSY Left    benign  . BREAST LUMPECTOMY WITH RADIOACTIVE SEED LOCALIZATION Left 12/17/2016   Procedure: LEFT BREAST LUMPECTOMY WITH RADIOACTIVE SEED LOCALIZATION;  Surgeon: Fanny Skates, MD;  Location: [facility];  Service: General;  Laterality: Left;  . NO PAST SURGERIES      FAMILY HISTORY Family History  Problem Relation Age of Onset  . Breast cancer Mother    Her father is alive at 89 years old and her mother is alive at 71 (as of January 2019). She reports that her mother was diagnosed with breast cancer at age 26. She has 2 brothers and no sisters. She denies any other family history of cancer or breast/ovarian cancers in the family.    GYNECOLOGIC HISTORY:  No LMP recorded.  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 Her husband goes by "[employer]. She notes that they are originally from [place]. Her oldest is son, Mercy Riding, age 35 (as of 07/2018). Her youngest is daughter, Zorita Pang, age 46 (as of 07/2018). She doesn't belong to a religion.    ADVANCED DIRECTIVES:   HEALTH MAINTENANCE: Social History   Tobacco Use  . Smoking status: Never Smoker  . Smokeless tobacco: Never Used  Substance Use Topics  . Alcohol use: No  . Drug use: No    Colonoscopy: none  PAP: up to date with Dr. Ronita Hipps  Bone density: none   No Known Allergies  Current Outpatient Medications  Medication Sig Dispense Refill  . Lactobacillus (PROBIOTIC ACIDOPHILUS PO) Take by mouth.    . losartan (COZAAR) 50 MG tablet Take 50 mg by mouth daily.     . tamoxifen (NOLVADEX) 20 MG tablet Take 1 tablet (20 mg total) by mouth daily. 90 tablet 0  . Vitamin D, Cholecalciferol, 1000 units CAPS Take by mouth.     No current facility-administered medications for this visit.     OBJECTIVE: Young appearing [place] Asian woman who appears healthy  Vitals:   08/02/18 1414  BP: (!) 164/97  Pulse: 99  Resp: 18  Temp: 98.3 F (36.8 C)  SpO2: 99%     Body mass index is 21.95 kg/m.   Wt Readings from Last 3 Encounters:  08/02/18 136 lb (61.7 kg)  08/03/17 148 lb 4.8 oz (67.3 kg)  12/17/16 134 lb 6.4 oz (61 kg)      ECOG FS:0 - Asymptomatic  Sclerae unicteric, EOMs intact No cervical or supraclavicular adenopathy Lungs no rales or rhonchi Heart regular rate and rhythm Abd soft, nontender, positive bowel sounds MSK no focal spinal tenderness, no upper extremity lymphedema Neuro: nonfocal, well oriented, appropriate affect Breasts: The right breast is benign.  The left breast is status post lumpectomy.  I do not palpate a mass in either breast.   There are no skin or nipple changes of concern.  Both axillae are benign.  LAB RESULTS:  CMP     Component Value Date/Time   NA 137 12/13/2016 1215   K 4.6 12/13/2016 1215   CL 103 12/13/2016 1215   CO2 26 12/13/2016 1215   GLUCOSE 95 12/13/2016 1215   BUN 10 12/13/2016 1215   CREATININE 0.66 12/13/2016 1215   CALCIUM 9.1 12/13/2016 1215   PROT 7.2 12/13/2016 1215   ALBUMIN 4.2 12/13/2016 1215   AST 19 12/13/2016 1215   ALT 15 12/13/2016 1215   ALKPHOS 51 12/13/2016 1215   BILITOT 0.9 12/13/2016 1215   GFRNONAA >60 12/13/2016 1215   GFRAA >60 12/13/2016 1215    No results found for: TOTALPROTELP, ALBUMINELP, A1GS, A2GS, BETS, BETA2SER, GAMS, MSPIKE, SPEI  No results found for: KPAFRELGTCHN, LAMBDASER, KAPLAMBRATIO  Lab Results  Component Value Date   WBC 9.3 12/13/2016   NEUTROABS 5.9 12/13/2016   HGB 12.9 12/13/2016   HCT 39.3 12/13/2016   MCV 95.4 12/13/2016   PLT 311 12/13/2016    @LASTCHEMISTRY @  No results found for: LABCA2  No components found for: [id number]  No results for input(s): INR  in the last 168 hours.  No results found for: LABCA2  No results found for: ORV615  No results found for: PPH432  No results found for: XMD470  No results found for: CA2729  No components found for: HGQUANT  No results found for: CEA1 / No results found for: CEA1   No results found for: AFPTUMOR  No results found for: CHROMOGRNA  No results found for: PSA1  No visits with results within 3 Day(s) from this visit.  Latest known visit with results is:  Admission on 12/17/2016, Discharged on 12/17/2016  Component Date Value Ref Range Status  . WBC 12/13/2016 9.3  4.0 - 10.5 K/uL Final  . RBC 12/13/2016 4.12  3.87 - 5.11 MIL/uL Final  . Hemoglobin 12/13/2016 12.9  12.0 - 15.0 g/dL Final  . HCT 12/13/2016 39.3  36.0 - 46.0 % Final  . MCV 12/13/2016 95.4  78.0 - 100.0 fL Final  . MCH 12/13/2016 31.3  26.0 - 34.0 pg Final  . MCHC 12/13/2016 32.8  30.0 - 36.0  g/dL Final  . RDW 12/13/2016 12.1  11.5 - 15.5 % Final  . Platelets 12/13/2016 311  150 - 400 K/uL Final  . Neutrophils Relative % 12/13/2016 64  % Final  . Neutro Abs 12/13/2016 5.9  1.7 - 7.7 K/uL Final  . Lymphocytes Relative 12/13/2016 28  % Final  . Lymphs Abs 12/13/2016 2.6  0.7 - 4.0 K/uL Final  . Monocytes Relative 12/13/2016 5  % Final  . Monocytes Absolute 12/13/2016 0.5  0.1 - 1.0 K/uL Final  . Eosinophils Relative 12/13/2016 3  % Final  . Eosinophils Absolute 12/13/2016 0.3  0.0 - 0.7 K/uL Final  . Basophils Relative 12/13/2016 0  % Final  . Basophils Absolute 12/13/2016 0.0  0.0 - 0.1 K/uL Final  . Sodium 12/13/2016 137  135 - 145 mmol/L Final  . Potassium 12/13/2016 4.6  3.5 - 5.1 mmol/L Final  . Chloride 12/13/2016 103  101 - 111 mmol/L Final  . CO2 12/13/2016 26  22 - 32 mmol/L Final  . Glucose, Bld 12/13/2016 95  65 - 99 mg/dL Final  . BUN 12/13/2016 10  6 - 20 mg/dL Final  . Creatinine, Ser 12/13/2016 0.66  0.44 - 1.00 mg/dL Final  . Calcium 12/13/2016 9.1  8.9 - 10.3 mg/dL Final  . Total Protein 12/13/2016 7.2  6.5 - 8.1 g/dL Final  . Albumin 12/13/2016 4.2  3.5 - 5.0 g/dL Final  . AST 12/13/2016 19  15 - 41 U/L Final  . ALT 12/13/2016 15  14 - 54 U/L Final  . Alkaline Phosphatase 12/13/2016 51  38 - 126 U/L Final  . Total Bilirubin 12/13/2016 0.9  0.3 - 1.2 mg/dL Final  . GFR calc non Af Amer 12/13/2016 >60  >60 mL/min Final  . GFR calc Af Amer 12/13/2016 >60  >60 mL/min Final   Comment: (NOTE) The eGFR has been calculated using the CKD EPI equation. This calculation has not been validated in all clinical situations. eGFR's persistently <60 mL/min signify possible Chronic Kidney Disease.   . Anion gap 12/13/2016 8  5 - 15 Final    (this displays the last labs from the last 3 days)  No results found for: TOTALPROTELP, ALBUMINELP, A1GS, A2GS, BETS, BETA2SER, GAMS, MSPIKE, SPEI (this displays SPEP labs)  No results found for: KPAFRELGTCHN, LAMBDASER,  KAPLAMBRATIO (kappa/lambda light chains)  No results found for: HGBA, HGBA2QUANT, HGBFQUANT, HGBSQUAN (Hemoglobinopathy evaluation)   No results found for: LDH  No results found for: IRON, TIBC, IRONPCTSAT (Iron and TIBC)  No results found for: FERRITIN  Urinalysis No results found for: COLORURINE, APPEARANCEUR, LABSPEC, PHURINE, GLUCOSEU, HGBUR, BILIRUBINUR, KETONESUR, PROTEINUR, UROBILINOGEN, NITRITE, LEUKOCYTESUR   STUDIES: No results found.   ELIGIBLE FOR AVAILABLE RESEARCH PROTOCOL: no   ASSESSMENT: 48 y.o. [place], [place] woman status post left lumpectomy 12/17/2016 for atypical ductal hyperplasia  (1) breast density category C  (2) first-degree relative with history of breast cancer (mother in her 59s)  (13) first live birth older than age 68  (35) risk reduction strategies:  (a) tamoxifen started January 2019  (b) annual mammography with tomography (no MRI planned)  (c) biannual MD breast exam   PLAN: Mallory Stone is tolerating tamoxifen remarkably well.  The plan will be to continue that a total of 5 years.  A facilities fee has been added to the billing here and it is not covered by her insurance.  This is a great source of concern to her and she has many questions about it which I think were entirely appropriate.  Unfortunately I do not have a solution for her.  She will have her mammogram in May.  She would like to see me after the mammogram not before.  That is sensible.  Also she will be seeing Dr. Ronita Hipps in February.  If I see her in August and he continues to see her in February we will more effectively "tack team her" as far as exams are concerned.  Accordingly she will return to see me in August and I will see her on a yearly basis thereafter until she completes her 5 years of prophylaxis.  She understands that she is not in menopause even though she is not having periods.  Tamoxifen is working whether or not she has periods and indeed she may start having  regular periods, irregular periods, heavy periods, or light periods and in any case tamoxifen is still working.  What would be worrisome is if after not having periods for some time she has a single period.  She is aware of the risk of endometrial cancer with tamoxifen.  The other significant concern with tamoxifen of course is blood clots.  She understands the risk of that is the same as with her birth control pills.  If she develops unilateral ankle swelling or any other symptom of concern of course she will call  , Virgie Dad, MD  08/02/18 2:42 PM Medical Oncology and Hematology [facility] [place], [place] [address] Tel. [phone]    Fax. [phone]  I, Jacqualyn Posey am acting as a Education administrator for Chauncey Cruel, MD.   I, Lurline Del MD, have reviewed the above documentation for accuracy and completeness, and I agree with the above.

## 2018-08-02 ENCOUNTER — Inpatient Hospital Stay: Payer: Commercial Managed Care - PPO | Attending: Oncology | Admitting: Oncology

## 2018-08-02 VITALS — BP 164/97 | HR 99 | Temp 98.3°F | Resp 18 | Wt 136.0 lb

## 2018-08-02 DIAGNOSIS — I10 Essential (primary) hypertension: Secondary | ICD-10-CM | POA: Diagnosis not present

## 2018-08-02 DIAGNOSIS — Z803 Family history of malignant neoplasm of breast: Secondary | ICD-10-CM | POA: Diagnosis not present

## 2018-08-02 DIAGNOSIS — Z79810 Long term (current) use of selective estrogen receptor modulators (SERMs): Secondary | ICD-10-CM | POA: Diagnosis not present

## 2018-08-02 DIAGNOSIS — Z79899 Other long term (current) drug therapy: Secondary | ICD-10-CM | POA: Insufficient documentation

## 2018-08-02 DIAGNOSIS — N6092 Unspecified benign mammary dysplasia of left breast: Secondary | ICD-10-CM | POA: Insufficient documentation

## 2018-08-02 MED ORDER — TAMOXIFEN CITRATE 20 MG PO TABS: 20.0000 mg | ORAL_TABLET | Freq: Every day | ORAL | 0 refills | Status: DC

## 2019-01-01 ENCOUNTER — Other Ambulatory Visit: Payer: Self-pay

## 2019-01-01 ENCOUNTER — Ambulatory Visit
Admission: RE | Admit: 2019-01-01 | Discharge: 2019-01-01 | Disposition: A | Discharge status: Home / Self Care | Payer: Commercial Managed Care - PPO | Source: Ambulatory Visit | Attending: Oncology | Admitting: Oncology

## 2019-01-01 DIAGNOSIS — N6092 Unspecified benign mammary dysplasia of left breast: Secondary | ICD-10-CM

## 2019-01-07 ENCOUNTER — Other Ambulatory Visit: Payer: Self-pay | Admitting: Oncology

## 2019-03-02 ENCOUNTER — Telehealth: Payer: Self-pay | Admitting: Oncology

## 2019-03-02 ENCOUNTER — Other Ambulatory Visit: Payer: Self-pay | Admitting: Oncology

## 2019-03-02 NOTE — Telephone Encounter (Signed)
Scheduled appt per 8/21 sch  message - mailed letter with appt date and time

## 2019-03-05 ENCOUNTER — Ambulatory Visit: Payer: Commercial Managed Care - PPO | Admitting: Oncology

## 2019-04-11 ENCOUNTER — Other Ambulatory Visit: Payer: Self-pay | Admitting: Oncology

## 2019-07-21 ENCOUNTER — Other Ambulatory Visit: Payer: Self-pay | Admitting: Hematology and Oncology

## 2019-09-20 ENCOUNTER — Other Ambulatory Visit: Payer: Self-pay | Admitting: *Deleted

## 2019-09-20 MED ORDER — TAMOXIFEN CITRATE 20 MG PO TABS: 20.0000 mg | ORAL_TABLET | Freq: Every day | ORAL | 2 refills | Status: DC

## 2019-09-21 ENCOUNTER — Other Ambulatory Visit: Payer: Self-pay | Admitting: *Deleted

## 2019-09-21 MED ORDER — TAMOXIFEN CITRATE 20 MG PO TABS: 20.0000 mg | ORAL_TABLET | Freq: Every day | ORAL | 0 refills | Status: DC

## 2019-12-16 ENCOUNTER — Other Ambulatory Visit: Payer: Self-pay | Admitting: Oncology

## 2019-12-17 ENCOUNTER — Other Ambulatory Visit: Payer: Self-pay | Admitting: Oncology

## 2019-12-17 DIAGNOSIS — Z1231 Encounter for screening mammogram for malignant neoplasm of breast: Secondary | ICD-10-CM

## 2020-01-04 ENCOUNTER — Ambulatory Visit
Admission: RE | Admit: 2020-01-04 | Discharge: 2020-01-04 | Disposition: A | Discharge status: Home / Self Care | Payer: Self-pay | Source: Ambulatory Visit | Attending: Oncology | Admitting: Oncology

## 2020-01-04 ENCOUNTER — Other Ambulatory Visit: Payer: Self-pay

## 2020-01-04 DIAGNOSIS — Z1231 Encounter for screening mammogram for malignant neoplasm of breast: Secondary | ICD-10-CM

## 2020-03-02 NOTE — Progress Notes (Signed)
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 However, she did not show.  Since her last visit, she underwent bilateral screening mammography with tomography at [facility] on 01/04/2020 showing: breast density category C; no evidence of malignancy in either breast.    REVIEW OF SYSTEMS: [name]    HISTORY OF CURRENT ILLNESS: From the original intake note:  [name]") had routine screening mammography on 10/04/2016 showing a possible abnormality in the left breast. She underwent unilateral left diagnostic mammography with tomography at [facility] on 10/08/2016 showing: breast density category C. New indeterminate left breast calcifications spanning up to 7 mm.  Accordingly on 10/11/2016 she proceeded to biopsy of the left upper outer breast area in question. The pathology from this procedure showed ([id number]): Atypical ductal hyperplasia. Fibrocytic changes and uaual ductal hyperplasia. Associated microcalcifications. No malignancy identified.  She then proceeded to left breast lumpectomy ([id number]) on 12/17/2016 with pathology showing: Atypical ductal hyperplasia. Fibrocytic change. No malignancy identified.  The patient's subsequent history is as detailed below.   PAST MEDICAL HISTORY: Past Medical History:  Diagnosis Date  . Atypical ductal  hyperplasia of left breast 12/17/2016  . Breast mass, left   . Hypertension     PAST SURGICAL HISTORY: Past Surgical History:  Procedure Laterality Date  . BREAST EXCISIONAL BIOPSY Left    benign  . BREAST LUMPECTOMY WITH RADIOACTIVE SEED LOCALIZATION Left 12/17/2016   Procedure: LEFT BREAST LUMPECTOMY WITH RADIOACTIVE SEED LOCALIZATION;  Surgeon: Claud Kelp, MD;  [facility];  Service: General;  Laterality: Left;  . NO PAST SURGERIES      FAMILY HISTORY Family History  Problem Relation Age of Onset  . Breast cancer Mother 15   Her father is alive at 53 years old and her mother is alive at 84 (as of January 2019). She reports that her mother was diagnosed with breast cancer at age 49. She has 2 brothers and no sisters. She denies any other family history of cancer or breast/ovarian cancers in the family.    GYNECOLOGIC HISTORY:  No LMP recorded. 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 Her husband goes by "[employer]. She notes that they are originally from [place]. Her oldest is son, Mallory Stone, age 49 (as of 07/2018). Her youngest is daughter, Mallory Stone, age 49 (as of 07/2018). She doesn't belong to a religion.    ADVANCED DIRECTIVES:   HEALTH MAINTENANCE: Social History   Tobacco Use  . Smoking status: Never Smoker  . Smokeless tobacco: Never Used  Vaping Use  . Vaping Use: Never used  Substance Use Topics  . Alcohol use: No  . Drug use: No    Colonoscopy:  none  PAP: up to date with Dr. Billy Coast  Bone density: none   No Known Allergies  Current Outpatient Medications  Medication Sig Dispense Refill  . Lactobacillus (PROBIOTIC ACIDOPHILUS PO) Take by mouth.    . losartan  (COZAAR) 50 MG tablet Take 50 mg by mouth daily.     . tamoxifen (NOLVADEX) 20 MG tablet TAKE 1 TABLET BY MOUTH EVERY DAY 90 tablet 0  . Vitamin D, Cholecalciferol, 1000 units CAPS Take by mouth.     No current facility-administered medications for this visit.    OBJECTIVE:   There were no vitals filed for this visit.   There is no height or weight on file to calculate BMI.   Wt Readings from Last 3 Encounters:  08/02/18 136 lb (61.7 kg)  08/03/17 148 lb 4.8 oz (67.3 kg)  12/17/16 134 lb 6.4 oz (61 kg)      ECOG FS: LAB RESULTS:  CMP     Component Value Date/Time   NA 137 12/13/2016 1215   K 4.6 12/13/2016 1215   CL 103 12/13/2016 1215   CO2 26 12/13/2016 1215   GLUCOSE 95 12/13/2016 1215   BUN 10 12/13/2016 1215   CREATININE 0.66 12/13/2016 1215   CALCIUM 9.1 12/13/2016 1215   PROT 7.2 12/13/2016 1215   ALBUMIN 4.2 12/13/2016 1215   AST 19 12/13/2016 1215   ALT 15 12/13/2016 1215   ALKPHOS 51 12/13/2016 1215   BILITOT 0.9 12/13/2016 1215   GFRNONAA >60 12/13/2016 1215   GFRAA >60 12/13/2016 1215    No results found for: TOTALPROTELP, ALBUMINELP, A1GS, A2GS, BETS, BETA2SER, GAMS, MSPIKE, SPEI  No results found for: KPAFRELGTCHN, LAMBDASER, KAPLAMBRATIO  Lab Results  Component Value Date   WBC 9.3 12/13/2016   NEUTROABS 5.9 12/13/2016   HGB 12.9 12/13/2016   HCT 39.3 12/13/2016   MCV 95.4 12/13/2016   PLT 311 12/13/2016   No results found for: LABCA2  No components found for: [id number]  No results for input(s): INR in the last 168 hours.  No results found for: LABCA2  No results found for: RSW546  No results found for: EVO350  No results found for: KXF818  No results found for: CA2729  No components found for: HGQUANT  No results found for: CEA1 / No results found for: CEA1   No results found for: AFPTUMOR  No results found for: CHROMOGRNA  No results found for: HGBA, HGBA2QUANT, HGBFQUANT, HGBSQUAN (Hemoglobinopathy evaluation)   No  results found for: LDH  No results found for: IRON, TIBC, IRONPCTSAT (Iron and TIBC)  No results found for: FERRITIN  Urinalysis No results found for: COLORURINE, APPEARANCEUR, LABSPEC, PHURINE, GLUCOSEU, HGBUR, BILIRUBINUR, KETONESUR, PROTEINUR, UROBILINOGEN, NITRITE, LEUKOCYTESUR   STUDIES: No results found.   ELIGIBLE FOR AVAILABLE RESEARCH PROTOCOL: no   ASSESSMENT: 49 y.o. [place], [place] woman status post left lumpectomy 12/17/2016 for atypical ductal hyperplasia  (1) breast density category C  (2) first-degree relative with history of breast cancer (mother in her 28s)  (3) first live birth older than age 75  (67) risk reduction strategies:  (a) tamoxifen started January 2019  (b) annual mammography with tomography (no MRI planned)  (c) biannual MD breast exam   PLAN: Mallory Stone did not show for her appointment 03/03/2020.  Follow-up letter has been sent  Anastashia Westerfeld, Valentino Hue, MD  03/03/20 3:12 PM Medical Oncology and Hematology [facility] [address] [place], [place] [address] Tel. [phone]    Fax. [phone]  I, Wilburn Mylar, am acting as scribe for Dr. Sarajane Jews C. Shanaya Schneck.  I, Lurline Del MD, have reviewed the above documentation for accuracy and completeness, and I agree with the above.    *Total Encounter Time as defined by the Centers for Medicare and Medicaid Services includes, in addition to the face-to-face time of a patient visit (documented in the note above) non-face-to-face time: obtaining and reviewing outside history, ordering and reviewing medications, tests or procedures, care coordination (communications with other health care professionals or caregivers) and documentation in the medical record.

## 2020-03-03 ENCOUNTER — Inpatient Hospital Stay: Payer: Managed Care, Other (non HMO) | Attending: Oncology | Admitting: Oncology

## 2020-03-03 ENCOUNTER — Inpatient Hospital Stay: Payer: Managed Care, Other (non HMO)

## 2020-03-03 ENCOUNTER — Encounter: Payer: Self-pay | Admitting: Oncology

## 2020-03-03 DIAGNOSIS — N6092 Unspecified benign mammary dysplasia of left breast: Secondary | ICD-10-CM

## 2020-04-16 NOTE — Progress Notes (Signed)
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  She continues on tamoxifen with good tolerance.  Hot flashes and vaginal dryness were an issue in the first few months but at this point she has no symptoms related to the medication.  Since her last visit, she underwent bilateral screening mammography with tomography at [facility] on 01/04/2020 showing: breast density category C; no evidence of malignancy in either breast.    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  She tries to get 10,000 steps most days.  She has had the vaccine for COVID-19 as had her husband and son.  A detailed review of systems today was otherwise stable.   HISTORY OF CURRENT ILLNESS: From the original intake note:  Mallory Stone") had routine screening mammography on 10/04/2016 showing a possible abnormality in the left breast. She underwent unilateral left diagnostic mammography with tomography at [facility] on 10/08/2016 showing: breast density category C. New indeterminate left breast calcifications spanning up to 7 mm.  Accordingly on 10/11/2016 she proceeded to biopsy of the left upper outer breast area in question. The pathology from this procedure showed ([id number]): Atypical  ductal hyperplasia. Fibrocytic changes and uaual ductal hyperplasia. Associated microcalcifications. No malignancy identified.  She then proceeded to left breast lumpectomy ([id number]) on 12/17/2016 with pathology showing: Atypical ductal hyperplasia. Fibrocytic change. No malignancy identified.  The patient's subsequent history is as detailed below.   PAST MEDICAL HISTORY: Past Medical History:  Diagnosis Date  . Atypical ductal hyperplasia of left breast 12/17/2016  . Breast mass, left   . Hypertension     PAST SURGICAL HISTORY: Past Surgical History:  Procedure Laterality Date  . BREAST EXCISIONAL BIOPSY Left    benign  . BREAST LUMPECTOMY WITH RADIOACTIVE SEED LOCALIZATION Left 12/17/2016   Procedure: LEFT BREAST LUMPECTOMY WITH RADIOACTIVE SEED LOCALIZATION;  Surgeon: Claud Kelp, MD;  Location:  SURGERY CENTER;  Service: General;  Laterality: Left;  . NO PAST SURGERIES      FAMILY HISTORY Family History  Problem Relation Age of Onset  . Breast cancer Mother 61   Her father is alive at 11 years old and her mother is alive at 94 (as of January 2019). She reports that her mother was diagnosed with breast cancer at age 100. She has 2 brothers and no sisters. She denies any other family history of cancer or breast/ovarian cancers in the family.    GYNECOLOGIC HISTORY:  No LMP recorded. 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 Her husband goes by "[employer]. She notes that they are originally from [place]. Her  oldest is son, [name], age 56 (as of 07/2018). Her youngest is daughter, [name], age 35 (as of 07/2018). She doesn't belong to a religion.    ADVANCED DIRECTIVES: In the absence of any  documentation to the contrary, the patient's spouse is their HCPOA.    HEALTH MAINTENANCE: Social History   Tobacco Use  . Smoking status: Never Smoker  . Smokeless tobacco: Never Used  Vaping Use  . Vaping Use: Never used  Substance Use Topics  . Alcohol use: No  . Drug use: No    Colonoscopy: none  PAP: up to date with Dr. Billy Coast  Bone density: none   No Known Allergies  Current Outpatient Medications  Medication Sig Dispense Refill  . Lactobacillus (PROBIOTIC ACIDOPHILUS PO) Take by mouth.    . losartan (COZAAR) 50 MG tablet Take 50 mg by mouth daily.     . tamoxifen (NOLVADEX) 20 MG tablet Take 1 tablet (20 mg total) by mouth daily. 90 tablet 4  . Vitamin D, Cholecalciferol, 1000 units CAPS Take by mouth.     No current facility-administered medications for this visit.    OBJECTIVE: E station woman who appears well  Vitals:   04/17/20 0907  BP: (!) 128/98  Pulse: 96  Resp: 17  Temp: 97.7 F (36.5 C)  SpO2: 97%     Body mass index is 22.71 kg/m.   Wt Readings from Last 3 Encounters:  04/17/20 140 lb 11.2 oz (63.8 kg)  08/02/18 136 lb (61.7 kg)  08/03/17 148 lb 4.8 oz (67.3 kg)      ECOG FS:1  Sclerae unicteric, EOMs intact Wearing a mask No cervical or supraclavicular adenopathy Lungs no rales or rhonchi Heart regular rate and rhythm Abd soft, nontender, positive bowel sounds MSK no focal spinal tenderness, no upper extremity lymphedema Neuro: nonfocal, well oriented, appropriate affect Breasts: The right breast is benign.  The left breast is status post lumpectomy.  It is also benign.  Both axillae are benign.   LAB RESULTS:  CMP     Component Value Date/Time   NA 137 12/13/2016 1215   K 4.6 12/13/2016 1215   CL 103 12/13/2016 1215   CO2 26 12/13/2016 1215   GLUCOSE 95 12/13/2016 1215   BUN 10 12/13/2016 1215   CREATININE 0.66 12/13/2016 1215   CALCIUM 9.1 12/13/2016 1215   PROT 7.2 12/13/2016 1215   ALBUMIN 4.2 12/13/2016 1215   AST 19  12/13/2016 1215   ALT 15 12/13/2016 1215   ALKPHOS 51 12/13/2016 1215   BILITOT 0.9 12/13/2016 1215   GFRNONAA >60 12/13/2016 1215   GFRAA >60 12/13/2016 1215    No results found for: TOTALPROTELP, ALBUMINELP, A1GS, A2GS, BETS, BETA2SER, GAMS, MSPIKE, SPEI  No results found for: KPAFRELGTCHN, LAMBDASER, KAPLAMBRATIO  Lab Results  Component Value Date   WBC 9.3 12/13/2016   NEUTROABS 5.9 12/13/2016   HGB 12.9 12/13/2016   HCT 39.3 12/13/2016   MCV 95.4 12/13/2016   PLT 311 12/13/2016   No results found for: LABCA2  No components found for: PJASNK539  No results for input(s): INR in the last 168 hours.  No results found for: LABCA2  No results found for: JQB341  No results found for: PFX902  No results found for: IOX735  No results found for: CA2729  No components found for: HGQUANT  No results found for: CEA1 / No results found for: CEA1   No results found for: AFPTUMOR  No results found for: [facility]  No results found for: HGBA, HGBA2QUANT, HGBFQUANT, HGBSQUAN (Hemoglobinopathy evaluation)   No results found for: LDH  No results found for: IRON, TIBC, IRONPCTSAT (Iron and TIBC)  No results found for: FERRITIN  Urinalysis No results found for: COLORURINE, APPEARANCEUR, LABSPEC, PHURINE, GLUCOSEU, HGBUR, BILIRUBINUR, KETONESUR, PROTEINUR, UROBILINOGEN, NITRITE, LEUKOCYTESUR   STUDIES: No results found.   ELIGIBLE FOR AVAILABLE RESEARCH PROTOCOL: no   ASSESSMENT: 49 y.o. [place], [place] woman status post left lumpectomy 12/17/2016 for atypical ductal hyperplasia  (1) breast density category C  (2) first-degree relative with history of breast cancer (mother in her 70s)  (3) first live birth older than age 74  (16) risk reduction strategies:  (a) tamoxifen started January 2019  (b) annual mammography with tomography (no MRI planned)  (c) biannual MD breast exam   PLAN: Mallory Stone is just about 3 years into her 5 years of tamoxifen.  She is  currently tolerating this well with no side effects that she is aware of.  We went ahead and refill that for her today.  We discussed her mammogram which still shows density C.  This can however be reduced if she continues on the antiestrogens as planned  She received her flu shot today  She will see me again in a year, after her next mammogram.  She knows to call for any other issue that may develop before then.  Total encounter time 25 minutes.*   Kalyb Pemble, Valentino Hue, MD  04/17/20 10:38 AM Medical Oncology and Hematology [facility] [address] [place], [place] [address] Tel. [phone]    Fax. [phone]   I, Mickie Bail, am acting as scribe for Dr. Valentino Hue. Braeden Dolinski.  I, Ruthann Cancer MD, have reviewed the above documentation for accuracy and completeness, and I agree with the above.   *Total Encounter Time as defined by the Centers for Medicare and Medicaid Services includes, in addition to the face-to-face time of a patient visit (documented in the note above) non-face-to-face time: obtaining and reviewing outside history, ordering and reviewing medications, tests or procedures, care coordination (communications with other health care professionals or caregivers) and documentation in the medical record.

## 2020-04-17 ENCOUNTER — Other Ambulatory Visit: Payer: Self-pay

## 2020-04-17 ENCOUNTER — Inpatient Hospital Stay: Payer: Managed Care, Other (non HMO) | Attending: Oncology | Admitting: Oncology

## 2020-04-17 ENCOUNTER — Other Ambulatory Visit: Payer: Managed Care, Other (non HMO)

## 2020-04-17 VITALS — BP 128/98 | HR 96 | Temp 97.7°F | Resp 17 | Ht 66.0 in | Wt 140.7 lb

## 2020-04-17 DIAGNOSIS — N6092 Unspecified benign mammary dysplasia of left breast: Secondary | ICD-10-CM

## 2020-04-17 DIAGNOSIS — Z9189 Other specified personal risk factors, not elsewhere classified: Secondary | ICD-10-CM

## 2020-04-17 DIAGNOSIS — Z23 Encounter for immunization: Secondary | ICD-10-CM

## 2020-04-17 MED ORDER — TAMOXIFEN CITRATE 20 MG PO TABS
20.0000 mg | ORAL_TABLET | Freq: Every day | ORAL | 4 refills | Status: DC
Start: 2020-04-17 — End: 2021-05-04

## 2020-04-17 MED ORDER — INFLUENZA VAC SPLIT QUAD 0.5 ML IM SUSY
0.5000 mL | PREFILLED_SYRINGE | Freq: Once | INTRAMUSCULAR | Status: AC
  Administered 2020-04-17: 0.5 mL via INTRAMUSCULAR

## 2020-04-17 MED ORDER — INFLUENZA VAC SPLIT QUAD 0.5 ML IM SUSY
PREFILLED_SYRINGE | INTRAMUSCULAR | Status: AC
  Filled 2020-04-17: qty 0.5

## 2020-04-17 NOTE — Addendum Note (Signed)
Addended by: Sabino Snipes on: 04/17/2020 11:07 AM   Modules accepted: Orders

## 2020-04-17 NOTE — Patient Instructions (Signed)
Influenza Virus Vaccine (Flucelvax) What is this medicine? INFLUENZA VIRUS VACCINE (in floo EN zuh VAHY ruhs vak SEEN) helps to reduce the risk of getting influenza also known as the flu. The vaccine only helps protect you against some strains of the flu. This medicine may be used for other purposes; ask your health care provider or pharmacist if you have questions. COMMON BRAND NAME(S): FLUCELVAX What should I tell my health care provider before I take this medicine? They need to know if you have any of these conditions:  bleeding disorder like hemophilia  fever or infection  Guillain-Barre syndrome or other neurological problems  immune system problems  infection with the human immunodeficiency virus (HIV) or AIDS  low blood platelet counts  multiple sclerosis  an unusual or allergic reaction to influenza virus vaccine, other medicines, foods, dyes or preservatives  pregnant or trying to get pregnant  breast-feeding How should I use this medicine? This vaccine is for injection into a muscle. It is given by a health care professional. A copy of Vaccine Information Statements will be given before each vaccination. Read this sheet carefully each time. The sheet may change frequently. Talk to your pediatrician regarding the use of this medicine in children. Special care may be needed. Overdosage: If you think you've taken too much of this medicine contact a poison control center or emergency room at once. Overdosage: If you think you have taken too much of this medicine contact a poison control center or emergency room at once. NOTE: This medicine is only for you. Do not share this medicine with others. What if I miss a dose? This does not apply. What may interact with this medicine?  chemotherapy or radiation therapy  medicines that lower your immune system like etanercept, anakinra, infliximab, and adalimumab  medicines that treat or prevent blood clots like  warfarin  phenytoin  steroid medicines like prednisone or cortisone  theophylline  vaccines This list may not describe all possible interactions. Give your health care provider a list of all the medicines, herbs, non-prescription drugs, or dietary supplements you use. Also tell them if you smoke, drink alcohol, or use illegal drugs. Some items may interact with your medicine. What should I watch for while using this medicine? Report any side effects that do not go away within 3 days to your doctor or health care professional. Call your health care provider if any unusual symptoms occur within 6 weeks of receiving this vaccine. You may still catch the flu, but the illness is not usually as bad. You cannot get the flu from the vaccine. The vaccine will not protect against colds or other illnesses that may cause fever. The vaccine is needed every year. What side effects may I notice from receiving this medicine? Side effects that you should report to your doctor or health care professional as soon as possible:  allergic reactions like skin rash, itching or hives, swelling of the face, lips, or tongue Side effects that usually do not require medical attention (Report these to your doctor or health care professional if they continue or are bothersome.):  fever  headache  muscle aches and pains  pain, tenderness, redness, or swelling at the injection site  tiredness This list may not describe all possible side effects. Call your doctor for medical advice about side effects. You may report side effects to FDA at 1-800-FDA-1088. Where should I keep my medicine? The vaccine will be given by a health care professional in a clinic, pharmacy, doctor's   office, or other health care setting. You will not be given vaccine doses to store at home. NOTE: This sheet is a summary. It may not cover all possible information. If you have questions about this medicine, talk to your doctor, pharmacist, or  health care provider.  2020 Elsevier/Gold Standard (2011-06-09 14:06:47)  

## 2020-12-02 ENCOUNTER — Other Ambulatory Visit: Payer: Self-pay | Admitting: Oncology

## 2020-12-02 DIAGNOSIS — Z1231 Encounter for screening mammogram for malignant neoplasm of breast: Secondary | ICD-10-CM

## 2021-01-29 ENCOUNTER — Ambulatory Visit
Admission: RE | Admit: 2021-01-29 | Discharge: 2021-01-29 | Disposition: A | Discharge status: Home / Self Care | Payer: Managed Care, Other (non HMO) | Source: Ambulatory Visit | Attending: Oncology | Admitting: Oncology

## 2021-01-29 ENCOUNTER — Other Ambulatory Visit: Payer: Self-pay

## 2021-01-29 DIAGNOSIS — Z1231 Encounter for screening mammogram for malignant neoplasm of breast: Secondary | ICD-10-CM

## 2021-03-16 ENCOUNTER — Other Ambulatory Visit: Payer: Self-pay | Admitting: Oncology

## 2021-04-13 ENCOUNTER — Telehealth: Payer: Self-pay | Admitting: Oncology

## 2021-04-13 NOTE — Telephone Encounter (Signed)
Rescheduled appointment per provider. Patient is aware. 

## 2021-04-16 ENCOUNTER — Ambulatory Visit: Payer: Managed Care, Other (non HMO) | Admitting: Oncology

## 2021-05-04 ENCOUNTER — Other Ambulatory Visit: Payer: Self-pay | Admitting: Oncology

## 2021-05-17 NOTE — Progress Notes (Incomplete)
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  She continues on tamoxifen with good tolerance.  Hot flashes and vaginal dryness were an issue in the first few months but at this point she has no symptoms related to the medication.  Since her last visit, she underwent bilateral screening mammography with tomography at [facility] on 01/29/2021 showing: breast density category C; no evidence of malignancy in either breast.    REVIEW OF SYSTEMS: Mallory Stone    COVID 19 VACCINATION STATUS:    HISTORY OF CURRENT ILLNESS: From the original intake note:  Mallory Stone") had routine screening mammography on 10/04/2016 showing a possible abnormality in the left breast. She underwent unilateral left diagnostic mammography with tomography at [facility] on 10/08/2016 showing: breast density category C. New indeterminate left breast calcifications spanning up to 7 mm.  Accordingly on 10/11/2016 she proceeded to biopsy of the left upper outer breast area in question. The pathology from this procedure showed ([id number]): Atypical ductal hyperplasia. Fibrocytic changes and uaual ductal hyperplasia. Associated microcalcifications. No malignancy identified.  She then proceeded to left breast lumpectomy ([id number]) on 12/17/2016 with pathology showing: Atypical ductal hyperplasia. Fibrocytic  change. No malignancy identified.  The patient's subsequent history is as detailed below.   PAST MEDICAL HISTORY: Past Medical History:  Diagnosis Date   Atypical ductal hyperplasia of left breast 12/17/2016   Breast mass, left    Hypertension     PAST SURGICAL HISTORY: Past Surgical History:  Procedure Laterality Date   BREAST EXCISIONAL BIOPSY Left    benign   BREAST LUMPECTOMY WITH RADIOACTIVE SEED LOCALIZATION Left 12/17/2016   Procedure: LEFT BREAST LUMPECTOMY WITH RADIOACTIVE SEED LOCALIZATION;  Surgeon: Claud Kelp, MD;  Location: [facility];  Service: General;  Laterality: Left;   NO PAST SURGERIES      FAMILY HISTORY Family History  Problem Relation Age of Onset   Breast cancer Mother 84  Her father is alive at 38 years old and her mother is alive at 42 (as of January 2019). She reports that her mother was diagnosed with breast cancer at age 61. She has 2 brothers and no sisters. She denies any other family history of cancer or breast/ovarian cancers in the family.    GYNECOLOGIC HISTORY:  No LMP recorded. 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 She notes that they are originally from [place]. Her oldest is son, [name], age 66 (as of 07/2018). Her youngest is daughter, [name], age 28 (as of 07/2018). She doesn't belong to a religion.    ADVANCED DIRECTIVES: In the absence of any documentation to the contrary, the patient's spouse is their HCPOA.  HEALTH MAINTENANCE: Social History   Tobacco Use   Smoking status: Never   Smokeless tobacco: Never  Vaping Use   Vaping Use: Never used  Substance Use Topics   Alcohol use: No   Drug use: No     Colonoscopy: none  PAP: up to date with Dr. Billy Coast  Bone density: none   No Known Allergies  Current Outpatient Medications  Medication Sig Dispense Refill   Lactobacillus (PROBIOTIC ACIDOPHILUS PO) Take by mouth.     losartan (COZAAR) 50 MG tablet Take 50 mg by mouth daily.      tamoxifen (NOLVADEX) 20 MG tablet TAKE 1 TABLET BY MOUTH EVERY DAY 90 tablet 4   Vitamin D, Cholecalciferol, 1000 units CAPS Take by mouth.     No current facility-administered medications for this visit.    OBJECTIVE: E station woman who appears well  There were no vitals filed for this visit.    There is no height or weight on file to calculate BMI.   Wt Readings from Last 3 Encounters:  04/17/20 140 lb 11.2 oz (63.8 kg)  08/02/18 136 lb (61.7 kg)  08/03/17 148 lb 4.8 oz (67.3 kg)      ECOG FS:1  Sclerae unicteric, EOMs intact Wearing a mask No cervical or supraclavicular adenopathy Lungs no rales or rhonchi Heart regular rate and rhythm Abd soft, nontender, positive bowel sounds MSK no focal spinal tenderness, no upper extremity lymphedema Neuro: nonfocal, well oriented, appropriate affect Breasts:    {Sclerae unicteric, EOMs intact Wearing a mask No cervical or supraclavicular adenopathy Lungs no rales or rhonchi Heart regular rate and rhythm Abd soft, nontender, positive bowel sounds MSK no focal spinal tenderness, no upper extremity lymphedema Neuro: nonfocal, well oriented, appropriate affect Breasts: The right breast is benign.  The left breast is status post lumpectomy.  It is also benign.  Both axillae are benign.}   LAB RESULTS:  CMP     Component Value Date/Time   NA 137 12/13/2016 1215   K 4.6 12/13/2016 1215   CL 103 12/13/2016 1215   CO2 26 12/13/2016 1215   GLUCOSE 95 12/13/2016 1215   BUN 10 12/13/2016 1215   CREATININE 0.66 12/13/2016 1215   CALCIUM 9.1 12/13/2016 1215   PROT 7.2 12/13/2016 1215   ALBUMIN 4.2 12/13/2016 1215   AST 19 12/13/2016 1215    ALT 15 12/13/2016 1215   ALKPHOS 51 12/13/2016 1215   BILITOT 0.9 12/13/2016 1215   GFRNONAA >60 12/13/2016 1215   GFRAA >60 12/13/2016 1215    No results found for: TOTALPROTELP, ALBUMINELP, A1GS, A2GS, BETS, BETA2SER, GAMS, MSPIKE, SPEI  No results found for: KPAFRELGTCHN, LAMBDASER, KAPLAMBRATIO  Lab Results  Component Value Date   WBC 9.3 12/13/2016   NEUTROABS 5.9 12/13/2016   HGB 12.9 12/13/2016   HCT 39.3 12/13/2016   MCV 95.4 12/13/2016   PLT 311 12/13/2016   No results found for: LABCA2  No components found for: [id number]  No results for input(s): INR in the last 168 hours.  No results found for: LABCA2  No results found for: PQD826  No results found for: EBR830  No results found for: NMM768  No results found for: CA2729  No components found for: HGQUANT  No results found for: CEA1 / No results found for: CEA1   No results found for: AFPTUMOR  No results found for: CHROMOGRNA  No results found for: HGBA, HGBA2QUANT, HGBFQUANT, HGBSQUAN (Hemoglobinopathy evaluation)   No results found for: LDH  No  results found for: IRON, TIBC, IRONPCTSAT (Iron and TIBC)  No results found for: FERRITIN  Urinalysis No results found for: COLORURINE, APPEARANCEUR, LABSPEC, PHURINE, GLUCOSEU, HGBUR, BILIRUBINUR, KETONESUR, PROTEINUR, UROBILINOGEN, NITRITE, LEUKOCYTESUR   STUDIES: No results found.   ELIGIBLE FOR AVAILABLE RESEARCH PROTOCOL: no   ASSESSMENT: 50 y.o. [place], [place] woman status post left lumpectomy 12/17/2016 for atypical ductal hyperplasia  (1) breast density category C  (2) first-degree relative with history of breast cancer (mother in her 58s)  (3) first live birth older than age 1  (25) risk reduction strategies:  (a) tamoxifen started January 2019  (b) annual mammography with tomography (no MRI planned)  (c) biannual MD breast exam   PLAN: Mallory Stone is just about 3 years into her 5 years of tamoxifen.  She is currently tolerating this  well with no side effects that she is aware of.  We went ahead and refill that for her today.  We discussed her mammogram which still shows density C.  This can however be reduced if she continues on the antiestrogens as planned  She received her flu shot today  She will see me again in a year, after her next mammogram.  She knows to call for any other issue that may develop before then.  Total encounter time 25 minutes.*   Jomarion Mish, Valentino Hue, MD  05/17/21 9:09 AM Medical Oncology and Hematology [facility] [address] [place], [place] [address] Tel. [phone]    Fax. [phone]   I, Mickie Bail, am acting as scribe for Dr. Valentino Hue. Masiel Gentzler.  I, Ruthann Cancer MD, have reviewed the above documentation for accuracy and completeness, and I agree with the above.   *Total Encounter Time as defined by the Centers for Medicare and Medicaid Services includes, in addition to the face-to-face time of a patient visit (documented in the note above) non-face-to-face time: obtaining and reviewing outside history, ordering and reviewing medications, tests or procedures, care coordination (communications with other health care professionals or caregivers) and documentation in the medical record.

## 2021-05-18 ENCOUNTER — Inpatient Hospital Stay: Payer: Managed Care, Other (non HMO) | Admitting: Oncology

## 2021-05-24 NOTE — Progress Notes (Signed)
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  She continues on tamoxifen with good tolerance.  She does not have hot flashes or vaginal wetness associated with this.  She stopped having periods when she started taking tamoxifen.    Mammography with tomography at [facility] on 01/29/2021 showed breast density category C; no evidence of malignancy in either breast.    REVIEW OF SYSTEMS: [facility] the other days.  She had COVID February of this year but it was of mild infection.  Work is busy and family is doing well.  A detailed review of systems today was otherwise noncontributory   COVID 19 VACCINATION STATUS: vaccinated x4 including the polyvalent injection November 2022; had COVID February 2022   HISTORY OF CURRENT ILLNESS: From the original intake note:  Mallory Stone") had routine screening mammography on 10/04/2016 showing a possible abnormality in the left breast. She underwent unilateral left diagnostic mammography with tomography at [facility] on 10/08/2016 showing: breast density category C. New indeterminate left breast calcifications spanning up to 7 mm.  Accordingly on 10/11/2016 she proceeded to biopsy of the left upper outer breast area in question. The pathology from this procedure showed ([id number]): Atypical  ductal hyperplasia. Fibrocytic changes and uaual ductal hyperplasia. Associated microcalcifications. No malignancy identified.  She then proceeded to left breast lumpectomy ([id number]) on 12/17/2016 with pathology showing: Atypical ductal hyperplasia. Fibrocytic change. No malignancy identified.  The patient's subsequent history is as detailed below.   PAST MEDICAL HISTORY: Past Medical History:  Diagnosis Date   Atypical ductal hyperplasia of left breast 12/17/2016   Breast mass, left    Hypertension     PAST SURGICAL HISTORY: Past Surgical History:  Procedure Laterality Date   BREAST EXCISIONAL BIOPSY Left    benign   BREAST LUMPECTOMY WITH RADIOACTIVE SEED LOCALIZATION Left 12/17/2016   Procedure: LEFT BREAST LUMPECTOMY WITH RADIOACTIVE SEED LOCALIZATION;  Surgeon: Claud Kelp, MD;  Location:  SURGERY CENTER;  Service: General;  Laterality: Left;   NO PAST SURGERIES      FAMILY HISTORY Family History  Problem Relation Age of Onset   Breast cancer Mother 71  Her father is alive at 79 years old and her mother is alive at 30 (as of January 2019). She reports that her mother was diagnosed with breast cancer at age 82. She has 2 brothers and no sisters. She denies any other family history of cancer or breast/ovarian cancers in the family.    GYNECOLOGIC HISTORY:  No LMP recorded. 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 Her husband goes by "[employer]. She notes that they are originally from [place].  Her oldest is son, [name], age 5). Her youngest is daughter, [name], age 50). She doesn't belong to a religion.    ADVANCED DIRECTIVES: In the absence of any documentation to the contrary, the  patient's spouse is their HCPOA.    HEALTH MAINTENANCE: Social History   Tobacco Use   Smoking status: Never   Smokeless tobacco: Never  Vaping Use   Vaping Use: Never used  Substance Use Topics   Alcohol use: No   Drug use: No    Colonoscopy: none  PAP: up to date with Dr. Billy Coast  Bone density: none   No Known Allergies  Current Outpatient Medications  Medication Sig Dispense Refill   Lactobacillus (PROBIOTIC ACIDOPHILUS PO) Take by mouth.     losartan (COZAAR) 50 MG tablet Take 50 mg by mouth daily.      tamoxifen (NOLVADEX) 20 MG tablet TAKE 1 TABLET BY MOUTH EVERY DAY 90 tablet 4   Vitamin D, Cholecalciferol, 1000 units CAPS Take by mouth.     No current facility-administered medications for this visit.    OBJECTIVE: Mallory Stone who appears well  Vitals:   05/25/21 1111  BP: (!) 154/81  Pulse: 91  Resp: 16  Temp: 98.1 F (36.7 C)  SpO2: 99%      Body mass index is 22.26 kg/m.   Wt Readings from Last 3 Encounters:  05/25/21 137 lb 14.4 oz (62.6 kg)  04/17/20 140 lb 11.2 oz (63.8 kg)  08/02/18 136 lb (61.7 kg)     ECOG FS:1  Sclerae unicteric, EOMs intact Wearing a mask No cervical or supraclavicular adenopathy Lungs no rales or rhonchi Heart regular rate and rhythm Abd soft, nontender, positive bowel sounds MSK no focal spinal tenderness, no upper extremity lymphedema Neuro: nonfocal, well oriented, appropriate affect Breasts: The right breast is unremarkable.  The left breast is status post lumpectomy.  There are no skin or nipple changes of concern.  Both axillae are benign.   LAB RESULTS:  CMP     Component Value Date/Time   NA 137 12/13/2016 1215   K 4.6 12/13/2016 1215   CL 103 12/13/2016 1215   CO2 26 12/13/2016 1215   GLUCOSE 95 12/13/2016 1215   BUN 10 12/13/2016 1215   CREATININE 0.66 12/13/2016 1215   CALCIUM 9.1 12/13/2016 1215   PROT 7.2 12/13/2016 1215   ALBUMIN 4.2 12/13/2016 1215   AST 19 12/13/2016 1215   ALT 15  12/13/2016 1215   ALKPHOS 51 12/13/2016 1215   BILITOT 0.9 12/13/2016 1215   GFRNONAA >60 12/13/2016 1215   GFRAA >60 12/13/2016 1215    No results found for: TOTALPROTELP, ALBUMINELP, A1GS, A2GS, BETS, BETA2SER, GAMS, MSPIKE, SPEI  No results found for: KPAFRELGTCHN, LAMBDASER, KAPLAMBRATIO  Lab Results  Component Value Date   WBC 9.3 12/13/2016   NEUTROABS 5.9 12/13/2016   HGB 12.9 12/13/2016   HCT 39.3 12/13/2016   MCV 95.4 12/13/2016   PLT 311 12/13/2016   No results found for: LABCA2  No components found for: EUMPNT614  No results for input(s): INR in the last 168 hours.  No results found for: LABCA2  No results found for: ERX540  No results found for: GQQ761  No results found for: PJK932  No results found for: CA2729  No components found for: HGQUANT  No results found for: CEA1 / No results found for: CEA1   No results found for: AFPTUMOR  No results found for: CHROMOGRNA  No results found for:  HGBA, HGBA2QUANT, HGBFQUANT, HGBSQUAN (Hemoglobinopathy evaluation)   No results found for: LDH  No results found for: IRON, TIBC, IRONPCTSAT (Iron and TIBC)  No results found for: FERRITIN  Urinalysis No results found for: COLORURINE, APPEARANCEUR, LABSPEC, PHURINE, GLUCOSEU, HGBUR, BILIRUBINUR, KETONESUR, PROTEINUR, UROBILINOGEN, NITRITE, LEUKOCYTESUR   STUDIES: No results found.   ELIGIBLE FOR AVAILABLE RESEARCH PROTOCOL: no   ASSESSMENT: 50 y.o. [place], [place] Stone status post left lumpectomy 12/17/2016 for atypical ductal hyperplasia  (1) breast density category C  (2) first-degree relative with history of breast cancer (mother in her 56s)  (3) first live birth older than age 42  (49) risk reduction strategies:  (a) tamoxifen started January 2019  (b) annual mammography with tomography (no MRI planned)  (c) biannual MD breast exam   PLAN: Mallory Stone is coming up on 4 years of tamoxifen.  She is tolerating this well.  The plan is to continue  it for 5 years.  We discussed the fact that we do sometimes continue tamoxifen beyond 5 years in patients who have a history of breast cancer.  When tamoxifen is taken prophylactically as is her situation then we generally do stop at 5 years.  I commended her excellent diet and exercise program.  She will have her next mammogram in July and we will see her again in August.  She knows to call for any other issue that may develop before the next visit  Total encounter time 20 minutes.*   Melvin Whiteford, Valentino Hue, MD  05/25/21 11:35 AM Medical Oncology and Hematology [facility] [address] [place], [place] [address] Tel. [phone]    Fax. [phone]   I, Mickie Bail, am acting as scribe for Dr. Valentino Hue. Dorri Ozturk.  I, Ruthann Cancer MD, have reviewed the above documentation for accuracy and completeness, and I agree with the above.   *Total Encounter Time as defined by the Centers for Medicare and Medicaid Services includes, in addition to the face-to-face time of a patient visit (documented in the note above) non-face-to-face time: obtaining and reviewing outside history, ordering and reviewing medications, tests or procedures, care coordination (communications with other health care professionals or caregivers) and documentation in the medical record.

## 2021-05-25 ENCOUNTER — Inpatient Hospital Stay: Payer: Managed Care, Other (non HMO) | Attending: Oncology | Admitting: Oncology

## 2021-05-25 ENCOUNTER — Other Ambulatory Visit: Payer: Self-pay

## 2021-05-25 VITALS — BP 154/81 | HR 91 | Temp 98.1°F | Resp 16 | Ht 66.0 in | Wt 137.9 lb

## 2021-05-25 DIAGNOSIS — Z79810 Long term (current) use of selective estrogen receptor modulators (SERMs): Secondary | ICD-10-CM | POA: Diagnosis not present

## 2021-05-25 DIAGNOSIS — N6092 Unspecified benign mammary dysplasia of left breast: Secondary | ICD-10-CM | POA: Insufficient documentation

## 2021-05-25 DIAGNOSIS — Z803 Family history of malignant neoplasm of breast: Secondary | ICD-10-CM | POA: Diagnosis not present

## 2021-05-25 DIAGNOSIS — Z9189 Other specified personal risk factors, not elsewhere classified: Secondary | ICD-10-CM

## 2021-12-22 ENCOUNTER — Telehealth: Payer: Self-pay | Admitting: Hematology and Oncology

## 2021-12-22 NOTE — Telephone Encounter (Signed)
Rescheduled appointment per providers template. Left message.  ? ?

## 2022-01-19 ENCOUNTER — Other Ambulatory Visit: Payer: Self-pay | Admitting: Hematology and Oncology

## 2022-01-19 DIAGNOSIS — Z1231 Encounter for screening mammogram for malignant neoplasm of breast: Secondary | ICD-10-CM

## 2022-02-03 ENCOUNTER — Ambulatory Visit
Admission: RE | Admit: 2022-02-03 | Discharge: 2022-02-03 | Disposition: A | Discharge status: Home / Self Care | Payer: Managed Care, Other (non HMO) | Source: Ambulatory Visit | Attending: Hematology and Oncology | Admitting: Hematology and Oncology

## 2022-02-03 DIAGNOSIS — Z1231 Encounter for screening mammogram for malignant neoplasm of breast: Secondary | ICD-10-CM

## 2022-02-24 ENCOUNTER — Inpatient Hospital Stay: Payer: Managed Care, Other (non HMO)

## 2022-02-24 ENCOUNTER — Ambulatory Visit: Payer: Managed Care, Other (non HMO) | Admitting: Hematology and Oncology

## 2022-02-24 ENCOUNTER — Inpatient Hospital Stay: Payer: Managed Care, Other (non HMO) | Admitting: Hematology and Oncology

## 2022-02-24 ENCOUNTER — Other Ambulatory Visit: Payer: Managed Care, Other (non HMO)

## 2022-03-23 ENCOUNTER — Other Ambulatory Visit: Payer: Self-pay | Admitting: *Deleted

## 2022-03-23 DIAGNOSIS — Z9189 Other specified personal risk factors, not elsewhere classified: Secondary | ICD-10-CM

## 2022-03-23 DIAGNOSIS — N6092 Unspecified benign mammary dysplasia of left breast: Secondary | ICD-10-CM

## 2022-03-24 ENCOUNTER — Encounter: Payer: Self-pay | Admitting: Hematology and Oncology

## 2022-03-24 ENCOUNTER — Other Ambulatory Visit: Payer: Self-pay

## 2022-03-24 ENCOUNTER — Inpatient Hospital Stay: Payer: Managed Care, Other (non HMO) | Admitting: Hematology and Oncology

## 2022-03-24 ENCOUNTER — Inpatient Hospital Stay: Payer: Managed Care, Other (non HMO) | Attending: Hematology and Oncology

## 2022-03-24 VITALS — BP 162/82 | HR 104 | Temp 98.2°F | Resp 18 | Ht 66.0 in | Wt 130.8 lb

## 2022-03-24 DIAGNOSIS — Z9189 Other specified personal risk factors, not elsewhere classified: Secondary | ICD-10-CM

## 2022-03-24 DIAGNOSIS — N6092 Unspecified benign mammary dysplasia of left breast: Secondary | ICD-10-CM | POA: Insufficient documentation

## 2022-03-24 DIAGNOSIS — Z79810 Long term (current) use of selective estrogen receptor modulators (SERMs): Secondary | ICD-10-CM | POA: Insufficient documentation

## 2022-03-24 DIAGNOSIS — Z803 Family history of malignant neoplasm of breast: Secondary | ICD-10-CM | POA: Insufficient documentation

## 2022-03-24 LAB — CBC WITH DIFFERENTIAL (CANCER CENTER ONLY)
Abs Immature Granulocytes: 0.01 10*3/uL (ref 0.00–0.07)
Basophils Absolute: 0.1 10*3/uL (ref 0.0–0.1)
Basophils Relative: 1 %
Eosinophils Absolute: 0.2 10*3/uL (ref 0.0–0.5)
Eosinophils Relative: 3 %
HCT: 41.1 % (ref 36.0–46.0)
Hemoglobin: 13.9 g/dL (ref 12.0–15.0)
Immature Granulocytes: 0 %
Lymphocytes Relative: 35 %
Lymphs Abs: 2.3 10*3/uL (ref 0.7–4.0)
MCH: 31.4 pg (ref 26.0–34.0)
MCHC: 33.8 g/dL (ref 30.0–36.0)
MCV: 93 fL (ref 80.0–100.0)
Monocytes Absolute: 0.4 10*3/uL (ref 0.1–1.0)
Monocytes Relative: 5 %
Neutro Abs: 3.7 10*3/uL (ref 1.7–7.7)
Neutrophils Relative %: 56 %
Platelet Count: 296 10*3/uL (ref 150–400)
RBC: 4.42 MIL/uL (ref 3.87–5.11)
RDW: 11.9 % (ref 11.5–15.5)
WBC Count: 6.6 10*3/uL (ref 4.0–10.5)
nRBC: 0 % (ref 0.0–0.2)

## 2022-03-24 LAB — CMP (CANCER CENTER ONLY)
ALT: 14 U/L (ref 0–44)
AST: 20 U/L (ref 15–41)
Albumin: 4.6 g/dL (ref 3.5–5.0)
Alkaline Phosphatase: 42 U/L (ref 38–126)
Anion gap: 6 (ref 5–15)
BUN: 15 mg/dL (ref 6–20)
CO2: 28 mmol/L (ref 22–32)
Calcium: 9.4 mg/dL (ref 8.9–10.3)
Chloride: 105 mmol/L (ref 98–111)
Creatinine: 0.71 mg/dL (ref 0.44–1.00)
GFR, Estimated: >60 mL/min (ref >60)
Glucose, Bld: 111 mg/dL — ABNORMAL HIGH (ref 70–99)
Potassium: 3.7 mmol/L (ref 3.5–5.1)
Sodium: 139 mmol/L (ref 135–145)
Total Bilirubin: 0.4 mg/dL (ref 0.3–1.2)
Total Protein: 7.3 g/dL (ref 6.5–8.1)

## 2022-03-24 NOTE — Progress Notes (Signed)
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 Patient is tolerating tamoxifen very well.  She is understandably worried about coming off of tamoxifen in early 2024 when she completes 5 years of antiestrogen therapy and was wondering if she can take it for longer.  She states her mom had breast cancer at the age of 16.  She most recently had a mammogram which was unremarkable.  She denies any changes in her breast.  Rest of the pertinent 10 point ROS reviewed and negative   COVID 19 VACCINATION STATUS: vaccinated x4 including the polyvalent injection November 2022; had COVID February 2022   HISTORY OF CURRENT ILLNESS: From the original intake note:  Mallory Stone") had routine screening mammography on 10/04/2016 showing a possible abnormality in the left breast. She underwent unilateral left diagnostic mammography with tomography at [facility] on 10/08/2016 showing: breast density category C. New indeterminate left breast calcifications spanning up to 7 mm.  Accordingly on 10/11/2016 she proceeded to biopsy of the left upper outer breast area in question. The pathology from this procedure showed ([id number]): Atypical ductal hyperplasia. Fibrocytic changes and uaual ductal hyperplasia. Associated microcalcifications. No malignancy identified.  She then proceeded to left  breast lumpectomy ([id number]) on 12/17/2016 with pathology showing: Atypical ductal hyperplasia. Fibrocytic change. No malignancy identified.  The patient's subsequent history is as detailed below.   PAST MEDICAL HISTORY: Past Medical History:  Diagnosis Date   Atypical ductal hyperplasia of left breast 12/17/2016   Breast mass, left    Hypertension     PAST SURGICAL HISTORY: Past Surgical History:  Procedure Laterality Date   BREAST EXCISIONAL BIOPSY Left    benign   BREAST LUMPECTOMY WITH RADIOACTIVE SEED LOCALIZATION Left 12/17/2016   Procedure: LEFT BREAST LUMPECTOMY WITH RADIOACTIVE SEED LOCALIZATION;  Surgeon: Claud Kelp, MD;  Location: [facility];  Service: General;  Laterality: Left;   NO PAST SURGERIES      FAMILY HISTORY Family History  Problem Relation Age of Onset   Breast cancer Mother 105  Her father is alive at 82 years old and her mother is alive at 6 (as of January 2019). She reports that her mother was diagnosed with breast cancer at age 67. She has 2 brothers and no sisters. She denies any other family history of cancer or breast/ovarian cancers in the family.    GYNECOLOGIC HISTORY:  No LMP recorded. 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 She notes that they are originally from [place]. Her oldest is son, [name], age 36). Her youngest is daughter, [name], age 59). She doesn't belong to a religion.    ADVANCED DIRECTIVES: In the  absence of any documentation to the contrary, the patient's spouse is their HCPOA.    HEALTH MAINTENANCE: Social History   Tobacco Use   Smoking status: Never   Smokeless tobacco: Never  Vaping Use    Vaping Use: Never used  Substance Use Topics   Alcohol use: No   Drug use: No    Colonoscopy: none  PAP: up to date with Dr. Billy Coast  Bone density: none   No Known Allergies  Current Outpatient Medications  Medication Sig Dispense Refill   Lactobacillus (PROBIOTIC ACIDOPHILUS PO) Take by mouth.     losartan (COZAAR) 50 MG tablet Take 50 mg by mouth daily.      tamoxifen (NOLVADEX) 20 MG tablet TAKE 1 TABLET BY MOUTH EVERY DAY 90 tablet 4   Vitamin D, Cholecalciferol, 1000 units CAPS Take by mouth.     No current facility-administered medications for this visit.    OBJECTIVE: [place] asian woman who appears well  There were no vitals filed for this visit.     There is no height or weight on file to calculate BMI.   Wt Readings from Last 3 Encounters:  05/25/21 137 lb 14.4 oz (62.6 kg)  04/17/20 140 lb 11.2 oz (63.8 kg)  08/02/18 136 lb (61.7 kg)     ECOG FS:1  Physical Exam Constitutional:      Appearance: Normal appearance.  Chest:     Comments: Both breasts examined.  No palpable masses.  She does have dense breasts and is of small masses may be obscured.  No palpable regional adenopathy. Musculoskeletal:     Cervical back: Normal range of motion and neck supple. No rigidity.  Lymphadenopathy:     Cervical: No cervical adenopathy.  Neurological:     Mental Status: She is alert.       LAB RESULTS:  CMP     Component Value Date/Time   NA 137 12/13/2016 1215   K 4.6 12/13/2016 1215   CL 103 12/13/2016 1215   CO2 26 12/13/2016 1215   GLUCOSE 95 12/13/2016 1215   BUN 10 12/13/2016 1215   CREATININE 0.66 12/13/2016 1215   CALCIUM 9.1 12/13/2016 1215   PROT 7.2 12/13/2016 1215   ALBUMIN 4.2 12/13/2016 1215   AST 19 12/13/2016 1215   ALT 15 12/13/2016 1215   ALKPHOS 51 12/13/2016 1215   BILITOT 0.9 12/13/2016 1215   GFRNONAA >60 12/13/2016 1215   GFRAA >60 12/13/2016 1215    No results found for: "TOTALPROTELP", "ALBUMINELP", "A1GS", "A2GS", "BETS",  "BETA2SER", "GAMS", "MSPIKE", "SPEI"  No results found for: "KPAFRELGTCHN", "LAMBDASER", "KAPLAMBRATIO"  Lab Results  Component Value Date   WBC 9.3 12/13/2016   NEUTROABS 5.9 12/13/2016   HGB 12.9 12/13/2016   HCT 39.3 12/13/2016   MCV 95.4 12/13/2016   PLT 311 12/13/2016   No results found for: "LABCA2"  No components found for: "ZDGLOV564"  No results for input(s): "INR" in the last 168 hours.  No results found for: "LABCA2"  No results found for: "PPI951"  No results found for: "CAN125"  No results found for: "CAN153"  No results found for: "CA2729"  No components found for: "HGQUANT"  No results found for: "CEA1", "CEA" / No results found for: "CEA1", "CEA"   No results found for: "AFPTUMOR"  No results found for: "CHROMOGRNA"  No results found for: "HGBA", "HGBA2QUANT", "HGBFQUANT", "HGBSQUAN" (Hemoglobinopathy evaluation)   No results found for: "LDH"  No results found for: "IRON", "TIBC", "IRONPCTSAT" (Iron and TIBC)  No  results found for: "FERRITIN"  Urinalysis No results found for: "COLORURINE", "APPEARANCEUR", "LABSPEC", "PHURINE", "GLUCOSEU", "HGBUR", "BILIRUBINUR", "KETONESUR", "PROTEINUR", "UROBILINOGEN", "NITRITE", "LEUKOCYTESUR"   STUDIES: No results found.   ELIGIBLE FOR AVAILABLE RESEARCH PROTOCOL: no   ASSESSMENT: 51 y.o. [place], [place] woman status post left lumpectomy 12/17/2016 for atypical ductal hyperplasia  (1) breast density category C  (2) first-degree relative with history of breast cancer (mother in her 69s)  (3) first live birth older than age 73  (40) risk reduction strategies:  (a) tamoxifen started January 2019  (b) annual mammography with tomography with MRI alternating  (c) biannual MD breast exam   PLAN:  Ms. Mallory Stone will finish 5 years of tamoxifen in January 2024 for atypical ductal hyperplasia. She is nervous about coming off of antiestrogen therapy and was wondering if there is a role for extended  antiestrogen therapy.  We have clearly discussed that in atypical ductal hyperplasia, we do not have a clear role of extended antiestrogen therapy and the risks of extended tamoxifen such as blood clots and endometrial cancer can double.  We have discussed that even though women stopped tamoxifen, there seems to be a persistent benefit with decreased risk of breast cancer.  We have discussed about considering MRIs in addition to mammogram for breast cancer screening especially given her breast density and since she falls and high risk for breast cancer.  We have discussed that the long-term risks of gadolinium deposition is unknown.  MRI can be very sensitive and can lead to additional biopsies. She is willing to try the MRI alternating with mammogram for now.  As her breast density drops and as she gets older, we can slow down on the frequency of MRI.  She will return to clinic in 6 months for breast exam.  Self breast exam recommended monthly.  All her questions were answered to the best my knowledge. Total time spent: 30 minutes  *Total Encounter Time as defined by the Centers for Medicare and Medicaid Services includes, in addition to the face-to-face time of a patient visit (documented in the note above) non-face-to-face time: obtaining and reviewing outside history, ordering and reviewing medications, tests or procedures, care coordination (communications with other health care professionals or caregivers) and documentation in the medical record.

## 2022-06-01 IMAGING — MG MM DIGITAL SCREENING BILAT W/ TOMO AND CAD
8 series · 8 of 24 positions shown · non-contrast
Comparison: Previous exam(s).

CLINICAL DATA: Screening.

EXAM:
DIGITAL SCREENING BILATERAL MAMMOGRAM WITH TOMOSYNTHESIS AND CAD
TECHNIQUE: Bilateral screening digital craniocaudal and mediolateral oblique
mammograms were obtained. Bilateral screening digital breast
tomosynthesis was performed. The images were evaluated with
computer-aided detection.

[R CC synth-2D]
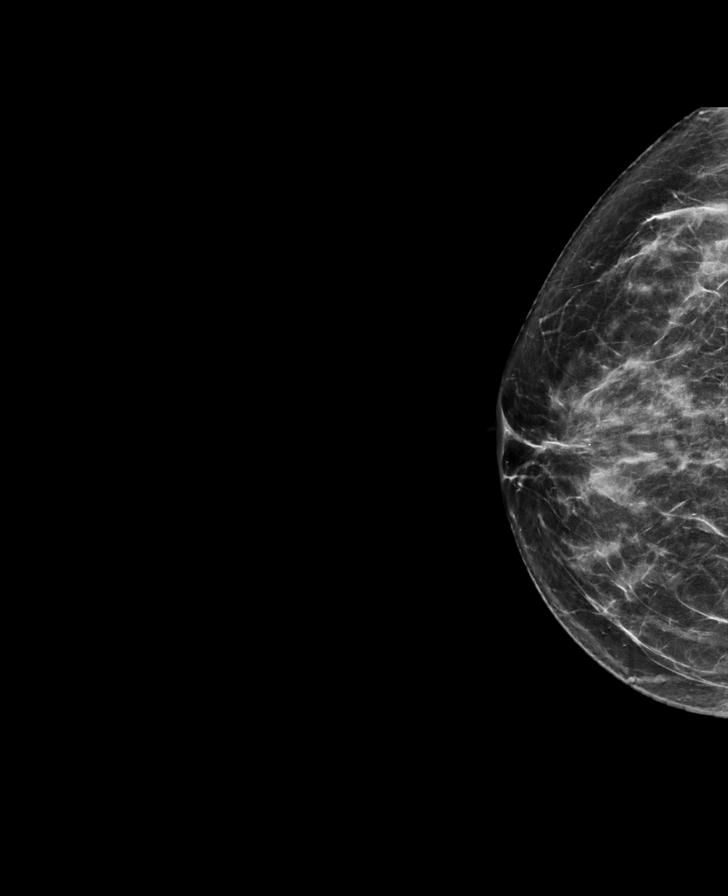

[L CC synth-2D]
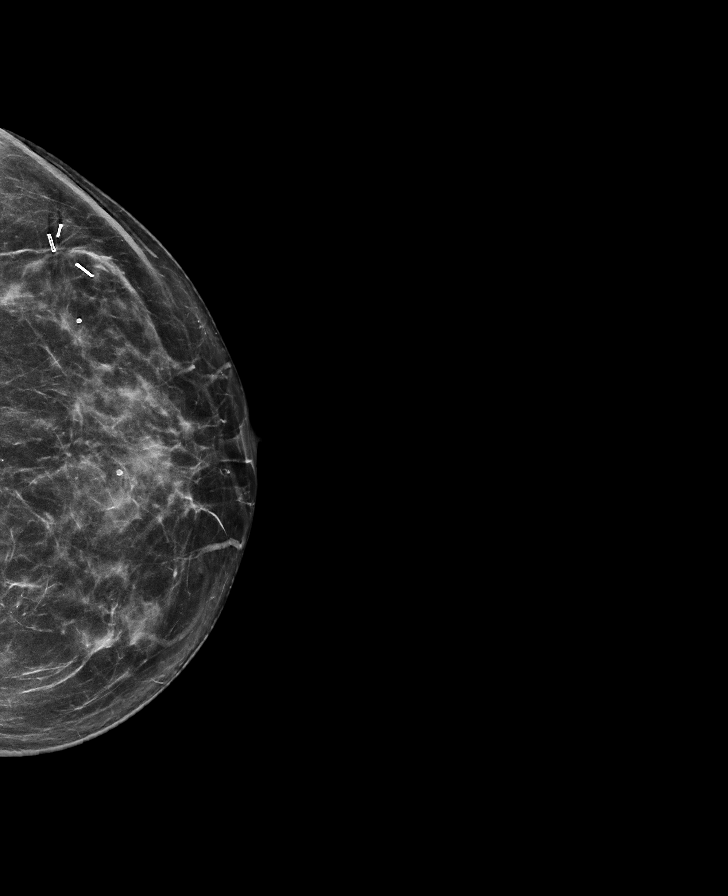

[R MLO synth-2D]
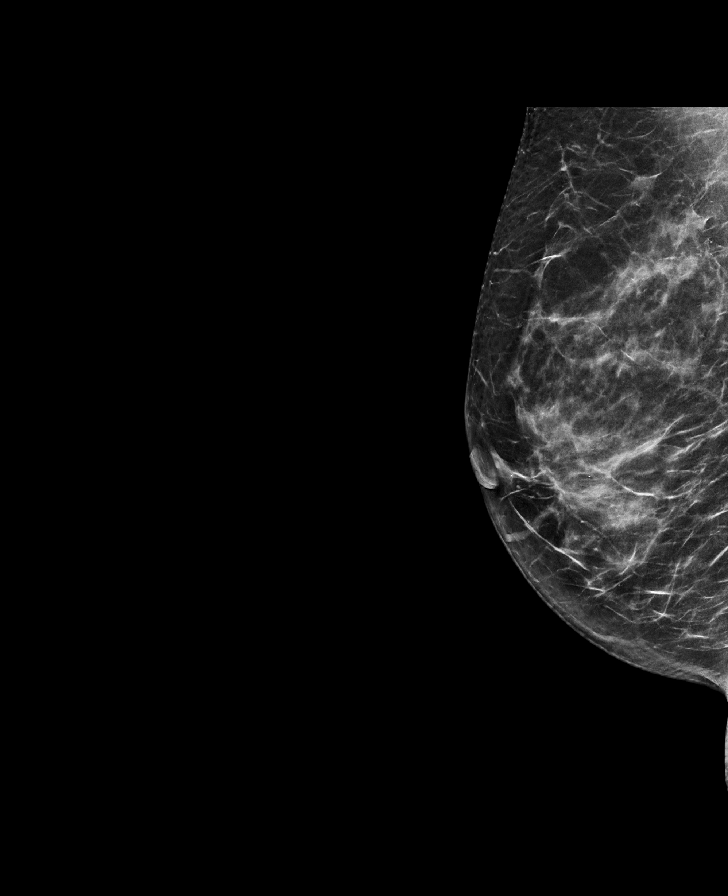

[L MLO synth-2D]
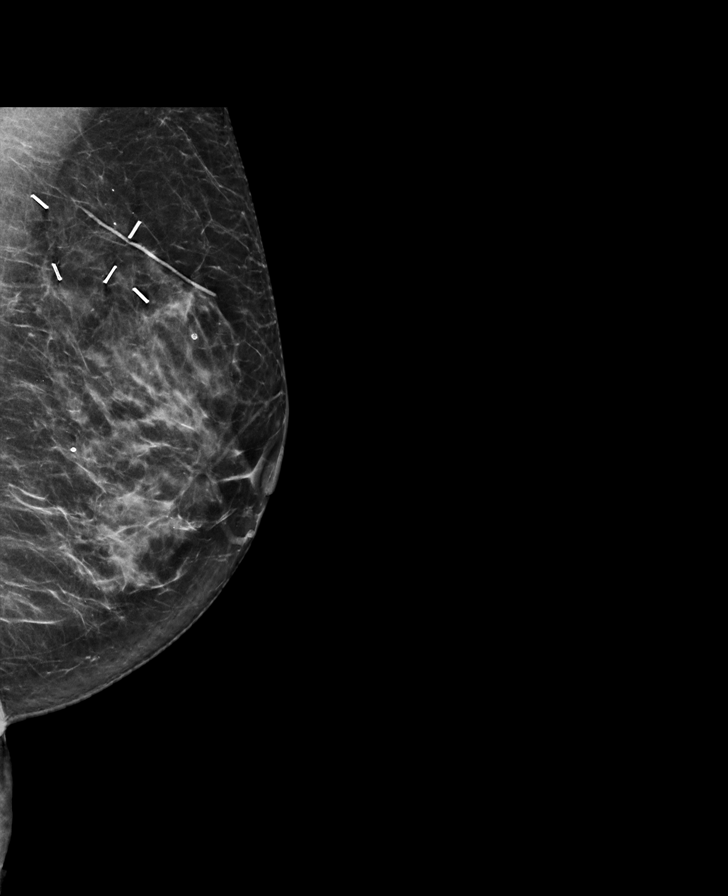

[L MLO tomo · tomo slice 35/70.0]
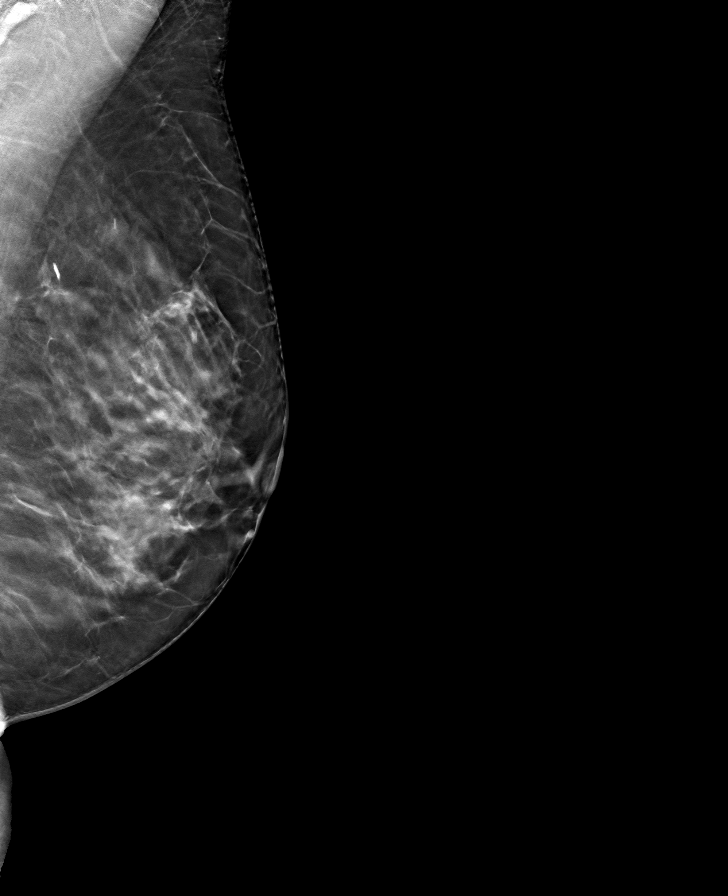

[L CC tomo · tomo slice 37/72.0]
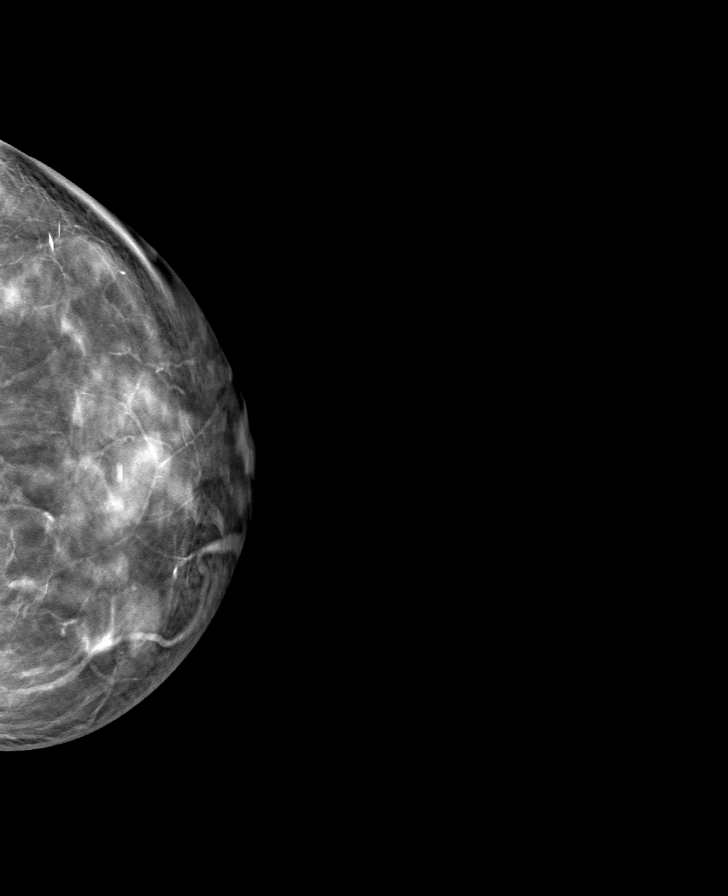

[R CC tomo · tomo slice 35/69.0]
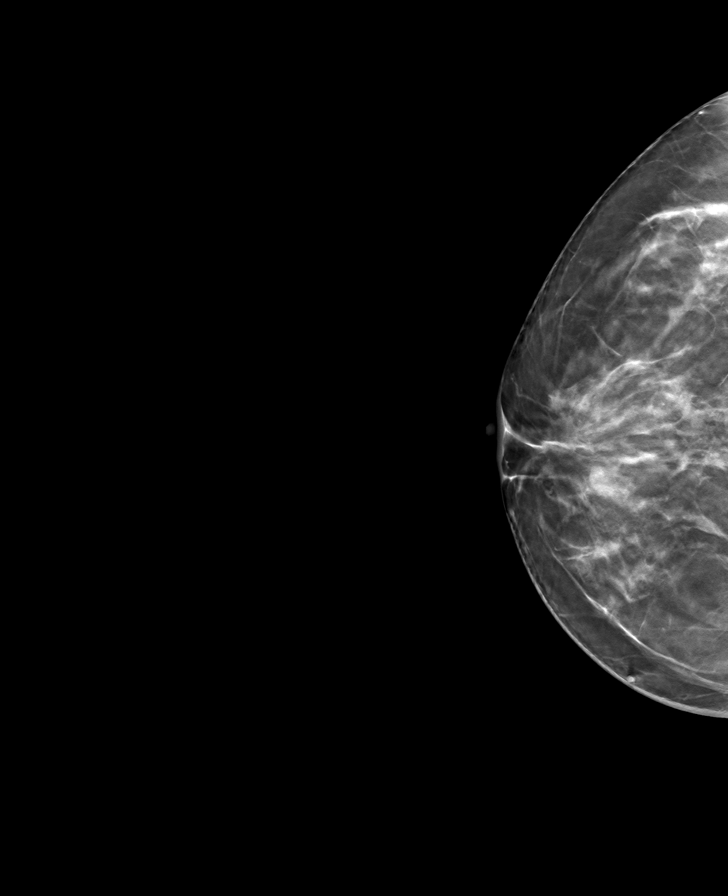

[R MLO tomo · tomo slice 35/69.0]
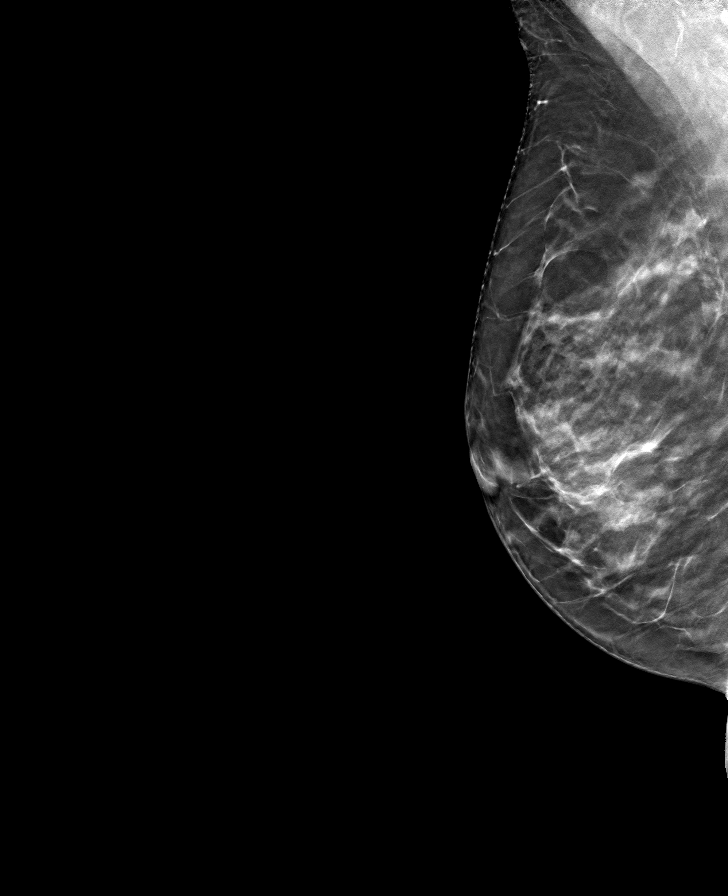

[8 of 24 positions shown; findings below may reference images not displayed]

ACR Breast Density Category c: The breast tissue is heterogeneously
dense, which may obscure small masses.
FINDINGS: There are no findings suspicious for malignancy.
IMPRESSION: No mammographic evidence of malignancy. A result letter of this
screening mammogram will be mailed directly to the patient.

RECOMMENDATION:
Screening mammogram in one year. (Code:[id number])

BI-RADS CATEGORY  1: Negative.

## 2022-09-13 ENCOUNTER — Telehealth: Payer: Self-pay | Admitting: Hematology and Oncology

## 2022-09-13 NOTE — Telephone Encounter (Signed)
Spoke with patient regarding appointment change

## 2022-09-16 ENCOUNTER — Ambulatory Visit (HOSPITAL_COMMUNITY)
Admission: RE | Admit: 2022-09-16 | Discharge: 2022-09-16 | Disposition: A | Discharge status: Home / Self Care | Payer: Managed Care, Other (non HMO) | Source: Ambulatory Visit | Attending: Hematology and Oncology | Admitting: Hematology and Oncology

## 2022-09-16 ENCOUNTER — Telehealth: Payer: Self-pay | Admitting: Hematology and Oncology

## 2022-09-16 DIAGNOSIS — N6092 Unspecified benign mammary dysplasia of left breast: Secondary | ICD-10-CM | POA: Insufficient documentation

## 2022-09-16 DIAGNOSIS — Z9189 Other specified personal risk factors, not elsewhere classified: Secondary | ICD-10-CM | POA: Insufficient documentation

## 2022-09-16 MED ORDER — GADOBUTROL 1 MMOL/ML IV SOLN
6.0000 mL | Freq: Once | INTRAVENOUS | Status: AC | PRN
  Administered 2022-09-16: 6 mL via INTRAVENOUS

## 2022-09-16 NOTE — Telephone Encounter (Signed)
R/s pt's appt time. Pt is aware.  

## 2022-09-17 ENCOUNTER — Telehealth: Payer: Self-pay

## 2022-09-17 NOTE — Telephone Encounter (Signed)
Called patient with message below. Patient verbalized understanding.

## 2022-09-17 NOTE — Telephone Encounter (Signed)
-----   Message from Gardenia Phlegm, NP sent at 09/17/2022 12:55 PM EST ----- Please let patient know screening MRI is negative.  This is good news ----- Message ----- From: Interface, Rad Results In Sent: 09/16/2022  12:11 PM EST To: Benay Pike, MD

## 2022-09-22 ENCOUNTER — Inpatient Hospital Stay: Payer: Managed Care, Other (non HMO) | Admitting: Hematology and Oncology

## 2022-09-22 ENCOUNTER — Encounter: Payer: Self-pay | Admitting: Hematology and Oncology

## 2022-09-22 ENCOUNTER — Inpatient Hospital Stay: Payer: Managed Care, Other (non HMO) | Attending: Hematology and Oncology | Admitting: Hematology and Oncology

## 2022-09-22 VITALS — BP 132/76 | HR 93 | Temp 98.4°F | Resp 16 | Ht 66.0 in | Wt 132.6 lb

## 2022-09-22 DIAGNOSIS — N6092 Unspecified benign mammary dysplasia of left breast: Secondary | ICD-10-CM

## 2022-09-22 DIAGNOSIS — Z803 Family history of malignant neoplasm of breast: Secondary | ICD-10-CM | POA: Diagnosis not present

## 2022-09-22 DIAGNOSIS — Z79810 Long term (current) use of selective estrogen receptor modulators (SERMs): Secondary | ICD-10-CM | POA: Insufficient documentation

## 2022-09-22 DIAGNOSIS — Z9189 Other specified personal risk factors, not elsewhere classified: Secondary | ICD-10-CM

## 2022-09-22 NOTE — Progress Notes (Signed)
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 Patient is tolerating tamoxifen very well.   Most recent MRI breast with no MRI evidence of malignancy. She is understandably anxious about coming off of anti estrogen. She is worried about genetic testing, want to think about it.  Recently her maternal first cousin was diagnosed with breast cancer in her 44's. Rest of the pertinent 10 point ROS reviewed and neg.   COVID 19 VACCINATION STATUS: vaccinated x4 including the polyvalent injection November 2022; had COVID February 2022   HISTORY OF CURRENT ILLNESS: From the original intake note:  Mallory Stone") had routine screening mammography on 10/04/2016 showing a possible abnormality in the left breast. She underwent unilateral left diagnostic mammography with tomography at [facility] on 10/08/2016 showing: breast density category C. New indeterminate left breast calcifications spanning up to 7 mm.  Accordingly on 10/11/2016 she proceeded to biopsy of the left upper outer breast area in question. The pathology from this procedure showed [id number] Atypical ductal hyperplasia. Fibrocytic changes and uaual ductal hyperplasia. Associated microcalcifications. No malignancy identified.  She then proceeded to left breast lumpectomy [id number] on 12/17/2016 with pathology showing:  Atypical ductal hyperplasia. Fibrocytic change. No malignancy identified.  The patient's subsequent history is as detailed below.   PAST MEDICAL HISTORY: Past Medical History:  Diagnosis Date   Atypical ductal hyperplasia of left breast 12/17/2016   Breast mass, left    Hypertension     PAST SURGICAL HISTORY: Past Surgical History:  Procedure Laterality Date   BREAST EXCISIONAL BIOPSY Left    benign   BREAST LUMPECTOMY WITH RADIOACTIVE SEED LOCALIZATION Left 12/17/2016   Procedure: LEFT BREAST LUMPECTOMY WITH RADIOACTIVE SEED LOCALIZATION;  Surgeon: Fanny Skates, MD;  Location: [facility];  Service: General;  Laterality: Left;   NO PAST SURGERIES      FAMILY HISTORY Family History  Problem Relation Age of Onset   Breast cancer Mother 62  Her father is alive at 45 years old and her mother is alive at 52 (as of January 2019). She reports that her mother was diagnosed with breast cancer at age 65. She has 2 brothers and no sisters. She denies any other family history of cancer or breast/ovarian cancers in the family.    GYNECOLOGIC HISTORY:  No LMP recorded. 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 Her husband goes by "[employer]. She notes that they are originally from [place]. Her oldest is son, Mercy Riding, age 52). Her youngest is daughter, Zorita Pang, age 52). She doesn't belong to a religion.    ADVANCED DIRECTIVES: In the absence of any documentation to the contrary, the patient's spouse is their HCPOA.    HEALTH MAINTENANCE: Social  History   Tobacco Use   Smoking status: Never   Smokeless tobacco: Never  Vaping Use   Vaping Use: Never used  Substance Use Topics   Alcohol use: No    Drug use: No    Colonoscopy: none  PAP: up to date with Dr. Ronita Hipps  Bone density: none   No Known Allergies  Current Outpatient Medications  Medication Sig Dispense Refill   Lactobacillus (PROBIOTIC ACIDOPHILUS PO) Take by mouth.     losartan (COZAAR) 50 MG tablet Take 50 mg by mouth daily.      tamoxifen (NOLVADEX) 20 MG tablet TAKE 1 TABLET BY MOUTH EVERY DAY 90 tablet 4   Vitamin D, Cholecalciferol, 1000 units CAPS Take by mouth.     No current facility-administered medications for this visit.    OBJECTIVE: [place] woman who appears well  There were no vitals filed for this visit.     There is no height or weight on file to calculate BMI.   Wt Readings from Last 3 Encounters:  03/24/22 130 lb 12.8 oz (59.3 kg)  05/25/21 137 lb 14.4 oz (62.6 kg)  04/17/20 140 lb 11.2 oz (63.8 kg)     ECOG FS:1  Physical Exam Constitutional:      Appearance: Normal appearance.  Chest:     Comments: Both breasts examined.  No palpable masses.  Scattered density in the breasts. No palpable masses or regional adenopathy. Musculoskeletal:     Cervical back: Normal range of motion and neck supple. No rigidity.  Lymphadenopathy:     Cervical: No cervical adenopathy.  Neurological:     Mental Status: She is alert.       LAB RESULTS:  CMP     Component Value Date/Time   NA 139 03/24/2022 0934   K 3.7 03/24/2022 0934   CL 105 03/24/2022 0934   CO2 28 03/24/2022 0934   GLUCOSE 111 (H) 03/24/2022 0934   BUN 15 03/24/2022 0934   CREATININE 0.71 03/24/2022 0934   CALCIUM 9.4 03/24/2022 0934   PROT 7.3 03/24/2022 0934   ALBUMIN 4.6 03/24/2022 0934   AST 20 03/24/2022 0934   ALT 14 03/24/2022 0934   ALKPHOS 42 03/24/2022 0934   BILITOT 0.4 03/24/2022 0934   GFRNONAA >60 03/24/2022 0934   GFRAA >60 12/13/2016 1215    No results found for: "TOTALPROTELP", "ALBUMINELP", "A1GS", "A2GS", "BETS", "BETA2SER", "GAMS", "MSPIKE", "SPEI"  No results found for: "KPAFRELGTCHN",  "LAMBDASER", "KAPLAMBRATIO"  Lab Results  Component Value Date   WBC 6.6 03/24/2022   NEUTROABS 3.7 03/24/2022   HGB 13.9 03/24/2022   HCT 41.1 03/24/2022   MCV 93.0 03/24/2022   PLT 296 03/24/2022   No results found for: "LABCA2"  No components found for: "[id number]"  No results for input(s): "INR" in the last 168 hours.  No results found for: "LABCA2"  No results found for: "[id number]"  No results found for: "CAN125"  No results found for: "CAN153"  No results found for: "CA2729"  No components found for: "HGQUANT"  No results found for: "CEA1", "CEA" / No results found for: "CEA1", "CEA"   No results found for: "AFPTUMOR"  No results found for: "CHROMOGRNA"  No results found for: "HGBA", "HGBA2QUANT", "HGBFQUANT", "HGBSQUAN" (Hemoglobinopathy evaluation)   No results found for: "LDH"  No results found for: "IRON", "TIBC", "IRONPCTSAT" (Iron and TIBC)  No results found for: "FERRITIN"  Urinalysis No results found for: "COLORURINE", "APPEARANCEUR", "LABSPEC", "PHURINE", "GLUCOSEU", "HGBUR", "BILIRUBINUR", "KETONESUR", "PROTEINUR", "UROBILINOGEN", "NITRITE", "LEUKOCYTESUR"  STUDIES: MR BREAST BILATERAL W WO CONTRAST INC CAD  Result Date: 09/16/2022 CLINICAL DATA:  Lifetime risk greater than 30%. EXAM: BILATERAL BREAST MRI WITH AND WITHOUT CONTRAST TECHNIQUE: Multiplanar, multisequence MR images of both breasts were obtained prior to and following the intravenous administration of 6 ml of Gadavist Three-dimensional MR images were rendered by post-processing of the original MR data on an independent workstation. The three-dimensional MR images were interpreted, and findings are reported in the following complete MRI report for this study. Three dimensional images were evaluated at the independent interpreting workstation using the DynaCAD thin client. COMPARISON:  Screening mammogram 02/03/2022 and earlier FINDINGS: Breast composition: c. Heterogeneous fibroglandular  tissue. Background parenchymal enhancement: Minimal Right breast: No mass or abnormal enhancement. Left breast: No mass or abnormal enhancement. Lymph nodes: No abnormal appearing lymph nodes. Ancillary findings:  None. IMPRESSION: No MRI evidence for malignancy. RECOMMENDATION: Recommend screening mammogram in July of 2024. Based on the recommendations of the [place], annual screening MRI is suggested in addition to annual mammography if the patient has an estimated lifetime risk of developing breast cancer which is greater than 20%. BI-RADS CATEGORY  1: Negative. Electronically Signed   By: Nolon Nations M.D.   On: 09/16/2022 12:09     ELIGIBLE FOR AVAILABLE RESEARCH PROTOCOL: no  ASSESSMENT: 52 y.o. [place], [place] woman status post left lumpectomy 12/17/2016 for atypical ductal hyperplasia  (1) breast density category C  (2) first-degree relative with history of breast cancer (mother in her 27s)  (68) first live birth older than age 62  (44) risk reduction strategies:  (a) tamoxifen started January 2019  (b) annual mammography with tomography with MRI alternating  (c) biannual MD breast exam   PLAN:  Ms. Mallory Stone is almost done with 5 years of tamoxifen in January 2024 for atypical ductal hyperplasia. We have previously discussed upper no definitive benefit of long-term antiestrogen therapy especially in breast cancer prevention.  I have discussed about considering mammograms alternating with MRIs since she is very anxious about having breast cancer.  Her most recent MRI was without any evidence of malignancy.  At this time I recommended she continue mammograms and MRI screening, discontinue tamoxifen and continue surveillance. No major concerns on exam. I discussed about genetic testing but she said she is overwhelmed at this time, would like to discuss at her next visit with [place]  RTC in 6 months. Total time spent: 30 minutes  *Total Encounter Time as defined by the  Centers for Medicare and Medicaid Services includes, in addition to the face-to-face time of a patient visit (documented in the note above) non-face-to-face time: obtaining and reviewing outside history, ordering and reviewing medications, tests or procedures, care coordination (communications with other health care professionals or caregivers) and documentation in the medical record.

## 2022-09-23 ENCOUNTER — Telehealth: Payer: Self-pay | Admitting: Hematology and Oncology

## 2022-09-23 NOTE — Telephone Encounter (Signed)
Spoke with patient confirming upcoming appointments  

## 2022-12-22 ENCOUNTER — Other Ambulatory Visit: Payer: Self-pay | Admitting: Obstetrics and Gynecology

## 2022-12-22 DIAGNOSIS — N62 Hypertrophy of breast: Secondary | ICD-10-CM

## 2022-12-30 ENCOUNTER — Other Ambulatory Visit: Payer: Self-pay | Admitting: Obstetrics and Gynecology

## 2022-12-30 DIAGNOSIS — E2839 Other primary ovarian failure: Secondary | ICD-10-CM

## 2023-03-21 ENCOUNTER — Ambulatory Visit
Admission: RE | Admit: 2023-03-21 | Discharge: 2023-03-21 | Disposition: A | Discharge status: Home / Self Care | Payer: Managed Care, Other (non HMO) | Source: Ambulatory Visit | Attending: Hematology and Oncology | Admitting: Hematology and Oncology

## 2023-03-21 DIAGNOSIS — Z9189 Other specified personal risk factors, not elsewhere classified: Secondary | ICD-10-CM

## 2023-03-21 DIAGNOSIS — N6092 Unspecified benign mammary dysplasia of left breast: Secondary | ICD-10-CM

## 2023-03-28 ENCOUNTER — Inpatient Hospital Stay: Payer: Managed Care, Other (non HMO) | Attending: Hematology and Oncology | Admitting: Hematology and Oncology

## 2023-03-28 VITALS — BP 135/77 | HR 90 | Temp 98.1°F | Resp 17 | Wt 140.4 lb

## 2023-03-28 DIAGNOSIS — N6092 Unspecified benign mammary dysplasia of left breast: Secondary | ICD-10-CM | POA: Diagnosis present

## 2023-03-28 DIAGNOSIS — Z79810 Long term (current) use of selective estrogen receptor modulators (SERMs): Secondary | ICD-10-CM | POA: Diagnosis not present

## 2023-03-28 NOTE — Progress Notes (Signed)
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  History of Present Illness   The patient, with a history of breast cancer, presents after recently discontinuing Tamoxifen. She reports the resumption of her menstrual cycle after a five-year hiatus. The first cycle was characterized by heavy bleeding for the first two to three days, with the entire cycle lasting approximately one week. She is currently awaiting the start of her second cycle.  The patient also mentions a financial burden associated with her annual MRI, which cost her $1600 out-of-pocket this year. She expresses a desire for assistance in reducing this cost in the future.  In terms of lifestyle, the patient has recently switched to a jogging routine, aiming for 30 minutes a day. She did not specify the distance covered during this time.      Marland Kitchen   COVID 19 VACCINATION STATUS: vaccinated x4 including the polyvalent injection November 2022; had COVID February 2022   HISTORY OF CURRENT ILLNESS: From the original intake note:  Mallory Stone") had routine screening mammography on 10/04/2016 showing a possible abnormality in the left breast. She underwent unilateral left diagnostic mammography with [facility] on 10/08/2016 showing: breast density category C. New indeterminate left breast  calcifications spanning up to 7 mm.  Accordingly on 10/11/2016 she proceeded to biopsy of the left upper outer breast area in question. The pathology from this procedure showed ([id number]): Atypical ductal hyperplasia. Fibrocytic changes and uaual ductal hyperplasia. Associated microcalcifications. No malignancy identified.  She then proceeded to left breast lumpectomy ([id number]) on 12/17/2016 with pathology showing: Atypical ductal hyperplasia. Fibrocytic change. No malignancy identified.  The patient's subsequent history is as detailed below.   PAST MEDICAL HISTORY: Past Medical History:  Diagnosis Date   Atypical ductal hyperplasia of left breast 12/17/2016   Breast mass, left    Hypertension     PAST SURGICAL HISTORY: Past Surgical History:  Procedure Laterality Date   BREAST EXCISIONAL BIOPSY Left    benign   BREAST LUMPECTOMY WITH RADIOACTIVE SEED LOCALIZATION Left 12/17/2016   Procedure: LEFT BREAST LUMPECTOMY WITH RADIOACTIVE SEED LOCALIZATION;  Surgeon: Mallory Kelp, MD;  Location: [facility];  Service: General;  Laterality: Left;   NO PAST SURGERIES      FAMILY HISTORY Family History  Problem Relation Age of Onset   Breast cancer Mother 14  Her father is alive at 56 years old and her mother is alive at 34 (as of January 2019). She reports that her mother was diagnosed with breast cancer at age 65. She has 2 brothers and no sisters. She denies any other family history of cancer or breast/ovarian cancers in the family.    GYNECOLOGIC HISTORY:  No LMP recorded. 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 Her husband goes by "Mallory Stone. She notes that they are originally from [place]. Her  oldest is son, Mallory Stone, age 68). Her youngest is daughter, Mallory Stone, age 104). She doesn't belong to a religion.    ADVANCED DIRECTIVES: In the absence of any documentation to the contrary, the patient's spouse is their HCPOA.    HEALTH MAINTENANCE: Social History   Tobacco Use   Smoking status: Never   Smokeless tobacco: Never  Vaping Use   Vaping status: Never Used  Substance Use Topics   Alcohol use: No   Drug use: No    Colonoscopy: none  PAP: up to date with Dr. Billy Stone  Bone density: none   No Known Allergies  Current Outpatient Medications  Medication Sig Dispense Refill   Lactobacillus (PROBIOTIC ACIDOPHILUS PO) Take by mouth.     losartan (COZAAR) 50 MG tablet Take 50 mg by mouth daily.      tamoxifen (NOLVADEX) 20 MG tablet TAKE 1 TABLET BY MOUTH EVERY DAY 90 tablet 4   Vitamin D, Cholecalciferol, 1000 units CAPS Take by mouth.     No current facility-administered medications for this visit.    OBJECTIVE: [place] asian woman who appears well  Vitals:   03/28/23 1322  BP: 135/77  Pulse: 90  Resp: 17  Temp: 98.1 F (36.7 C)  SpO2: 100%       Body mass index is 22.66 kg/m.   Wt Readings from Last 3 Encounters:  03/28/23 140 lb 6.4 oz (63.7 kg)  09/22/22 132 lb 9.6 oz (60.1 kg)  03/24/22 130 lb 12.8 oz (59.3 kg)     ECOG FS:1  Physical Exam Constitutional:      Appearance: Normal appearance.  Chest:     Comments: Both breasts examined.  No palpable masses.  Scattered density in the breasts. No palpable masses or regional adenopathy. Musculoskeletal:     Cervical back: Normal range of motion and neck supple. No rigidity.  Lymphadenopathy:     Cervical: No cervical adenopathy.  Neurological:     Mental Status: She is alert.       LAB RESULTS:  CMP     Component Value Date/Time   NA 139 03/24/2022 0934   K 3.7 03/24/2022 0934   CL 105 03/24/2022 0934   CO2 28 03/24/2022 0934   GLUCOSE 111 (H) 03/24/2022 0934   BUN 15 03/24/2022 0934    CREATININE 0.71 03/24/2022 0934   CALCIUM 9.4 03/24/2022 0934   PROT 7.3 03/24/2022 0934   ALBUMIN 4.6 03/24/2022 0934   AST 20 03/24/2022 0934   ALT 14 03/24/2022 0934   ALKPHOS 42 03/24/2022 0934   BILITOT 0.4 03/24/2022 0934   GFRNONAA >60 03/24/2022 0934   GFRAA >60 12/13/2016 1215    No results found for: "TOTALPROTELP", "ALBUMINELP", "A1GS", "A2GS", "BETS", "BETA2SER", "GAMS", "MSPIKE", "SPEI"  No results found for: "KPAFRELGTCHN", "LAMBDASER", "KAPLAMBRATIO"  Lab Results  Component Value Date   WBC 6.6 03/24/2022   NEUTROABS 3.7 03/24/2022   HGB 13.9 03/24/2022   HCT 41.1 03/24/2022   MCV 93.0 03/24/2022   PLT 296 03/24/2022   No results found for: "LABCA2"  No components found for: "[id number]"  No results for input(s): "INR" in the last 168 hours.  No results found for: "LABCA2"  No results found for: "NWG956"  No results found for: "CAN125"  No results found for: "CAN153"  No results found for: "CA2729"  No components found for: "HGQUANT"  No results found for: "CEA1", "CEA" / No results found for: "CEA1", "CEA"   No results found for: "AFPTUMOR"  No results found for: "CHROMOGRNA"  No results found for: "HGBA", "HGBA2QUANT", "HGBFQUANT", "HGBSQUAN" (Hemoglobinopathy evaluation)   No results found for: "LDH"  No results found for: "IRON", "TIBC", "IRONPCTSAT" (Iron and TIBC)  No results found for: "FERRITIN"  Urinalysis No results found for: "COLORURINE", "APPEARANCEUR", "LABSPEC", "PHURINE", "GLUCOSEU", "HGBUR", "BILIRUBINUR", "KETONESUR", "PROTEINUR", "UROBILINOGEN", "NITRITE", "LEUKOCYTESUR"   STUDIES: MM 3D SCREENING MAMMOGRAM BILATERAL BREAST  Result Date: 03/22/2023 CLINICAL DATA:  Screening. EXAM: DIGITAL SCREENING BILATERAL MAMMOGRAM WITH TOMOSYNTHESIS AND CAD TECHNIQUE: Bilateral screening digital craniocaudal and mediolateral oblique mammograms were obtained. Bilateral screening digital breast tomosynthesis was performed. The images  were evaluated with computer-aided detection. COMPARISON:  Previous exam(s). ACR Breast Density Category c: The breasts are heterogeneously dense, which may obscure small masses. FINDINGS: There are no findings suspicious for malignancy. IMPRESSION: No mammographic evidence of malignancy. A result letter of this screening mammogram will be mailed directly to the patient. RECOMMENDATION: Screening mammogram in one year. (Code:SM-B-01Y) BI-RADS CATEGORY  1: Negative. Electronically Signed   By: Ted Mcalpine M.D.   On: 03/22/2023 15:06     ELIGIBLE FOR AVAILABLE RESEARCH PROTOCOL: no  ASSESSMENT: 52 y.o. [place], [place] woman status post left lumpectomy 12/17/2016 for atypical ductal hyperplasia  (1) breast density category C  (2) first-degree relative with history of breast cancer (mother in her 78s)  (3) first live birth older than age 7  (67) risk reduction strategies:  (a) tamoxifen started January 2019  (b) annual mammography with tomography with MRI alternating  (c) biannual MD breast exam   PLAN:  Assessment and Plan    Menstrual Cycle Resumption Resumption of menstrual cycle after discontinuation of Tamoxifen. Heavy bleeding and prolonged duration noted in the first cycle. No other associated symptoms reported. -Observe for regularity and pattern in the coming cycles.  Breast Cancer Surveillance History of breast cancer, currently off Tamoxifen. Recent mammogram was normal. Alternating MRI and mammogram for surveillance. -Order MRI for March 2025 at [facility] to potentially reduce cost. -Send note to insurance company regarding the necessity of MRI due to high lifetime risk of breast cancer recurrence (20-30%).  General Health Maintenance -Continue self breast examination monthly. -Follow-up as needed for any changes in menstrual cycle or breast symptoms.   RTC in 6 months. Total time spent: 30 minutes  *Total Encounter Time as defined by the Centers for  Medicare and Medicaid Services includes, in addition to the face-to-face time of a patient visit (documented in the note above) non-face-to-face time: obtaining and reviewing outside history, ordering and reviewing medications, tests or procedures, care coordination (communications with other health care professionals or caregivers) and documentation in the medical record.

## 2023-07-12 ENCOUNTER — Other Ambulatory Visit: Payer: Managed Care, Other (non HMO)

## 2023-09-19 ENCOUNTER — Other Ambulatory Visit: Payer: Managed Care, Other (non HMO)

## 2023-09-26 ENCOUNTER — Ambulatory Visit
Admission: RE | Admit: 2023-09-26 | Discharge: 2023-09-26 | Disposition: A | Discharge status: Home / Self Care | Source: Ambulatory Visit | Attending: Hematology and Oncology | Admitting: Hematology and Oncology

## 2023-09-26 DIAGNOSIS — N6092 Unspecified benign mammary dysplasia of left breast: Secondary | ICD-10-CM

## 2023-09-26 MED ORDER — GADOPICLENOL 0.5 MMOL/ML IV SOLN
6.0000 mL | Freq: Once | INTRAVENOUS | Status: AC | PRN
  Administered 2023-09-26: 6 mL via INTRAVENOUS

## 2024-03-23 ENCOUNTER — Other Ambulatory Visit: Payer: Self-pay | Admitting: Hematology and Oncology

## 2024-03-23 DIAGNOSIS — Z1231 Encounter for screening mammogram for malignant neoplasm of breast: Secondary | ICD-10-CM

## 2024-03-27 ENCOUNTER — Ambulatory Visit
Admission: RE | Admit: 2024-03-27 | Discharge: 2024-03-27 | Disposition: A | Discharge status: Home / Self Care | Source: Ambulatory Visit | Attending: Hematology and Oncology | Admitting: Hematology and Oncology

## 2024-03-27 DIAGNOSIS — Z1231 Encounter for screening mammogram for malignant neoplasm of breast: Secondary | ICD-10-CM

## 2024-08-03 ENCOUNTER — Telehealth: Payer: Self-pay | Admitting: Hematology and Oncology

## 2024-08-03 NOTE — Telephone Encounter (Signed)
 I spoke with patient as she called in to schedule annual MD visit for 08/30/2024.

## 2024-08-30 ENCOUNTER — Inpatient Hospital Stay: Admitting: Hematology and Oncology
